# Patient Record
Sex: Female | Born: 1965 | Race: White | Hispanic: No | Marital: Married | Smoking: Never smoker
Health system: Southern US, Community
[De-identification: ages and names within clinical notes are randomized; demographics above are authoritative.]

## PROBLEM LIST (undated history)

## (undated) DIAGNOSIS — G43909 Migraine, unspecified, not intractable, without status migrainosus: Secondary | ICD-10-CM

## (undated) HISTORY — DX: Migraine, unspecified, not intractable, without status migrainosus: G43.909

## (undated) HISTORY — PX: BACK SURGERY: SHX140

---

## 1999-11-03 ENCOUNTER — Other Ambulatory Visit: Admission: RE | Admit: 1999-11-03 | Discharge: 1999-11-03 | Payer: Self-pay | Admitting: Obstetrics and Gynecology

## 2000-05-20 ENCOUNTER — Inpatient Hospital Stay (HOSPITAL_COMMUNITY): Admission: AD | Admit: 2000-05-20 | Discharge: 2000-05-23 | Payer: Self-pay | Admitting: Obstetrics and Gynecology

## 2000-06-22 ENCOUNTER — Other Ambulatory Visit: Admission: RE | Admit: 2000-06-22 | Discharge: 2000-06-22 | Payer: Self-pay | Admitting: Obstetrics and Gynecology

## 2002-07-12 ENCOUNTER — Other Ambulatory Visit: Admission: RE | Admit: 2002-07-12 | Discharge: 2002-07-12 | Payer: Self-pay | Admitting: Obstetrics and Gynecology

## 2003-11-08 ENCOUNTER — Other Ambulatory Visit: Admission: RE | Admit: 2003-11-08 | Discharge: 2003-11-08 | Payer: Self-pay | Admitting: Obstetrics and Gynecology

## 2005-05-20 ENCOUNTER — Encounter: Admission: RE | Admit: 2005-05-20 | Discharge: 2005-05-20 | Payer: Self-pay | Admitting: Orthopedic Surgery

## 2005-06-07 ENCOUNTER — Encounter: Admission: RE | Admit: 2005-06-07 | Discharge: 2005-06-07 | Payer: Self-pay | Admitting: Orthopedic Surgery

## 2005-09-03 ENCOUNTER — Encounter: Admission: RE | Admit: 2005-09-03 | Discharge: 2005-09-03 | Payer: Self-pay | Admitting: Orthopedic Surgery

## 2005-09-28 ENCOUNTER — Emergency Department (HOSPITAL_COMMUNITY): Admission: EM | Admit: 2005-09-28 | Discharge: 2005-09-28 | Payer: Self-pay | Admitting: Emergency Medicine

## 2006-03-21 ENCOUNTER — Encounter: Admission: RE | Admit: 2006-03-21 | Discharge: 2006-03-21 | Payer: Self-pay | Admitting: Orthopedic Surgery

## 2006-04-07 ENCOUNTER — Encounter: Admission: RE | Admit: 2006-04-07 | Discharge: 2006-04-07 | Payer: Self-pay | Admitting: *Deleted

## 2006-05-09 ENCOUNTER — Encounter: Admission: RE | Admit: 2006-05-09 | Discharge: 2006-05-09 | Payer: Self-pay | Admitting: *Deleted

## 2006-05-23 ENCOUNTER — Encounter: Admission: RE | Admit: 2006-05-23 | Discharge: 2006-05-23 | Payer: Self-pay | Admitting: *Deleted

## 2006-07-26 ENCOUNTER — Emergency Department (HOSPITAL_COMMUNITY): Admission: EM | Admit: 2006-07-26 | Discharge: 2006-07-26 | Payer: Self-pay | Admitting: Emergency Medicine

## 2006-07-29 ENCOUNTER — Encounter: Admission: RE | Admit: 2006-07-29 | Discharge: 2006-07-29 | Payer: Self-pay | Admitting: Orthopedic Surgery

## 2008-12-26 ENCOUNTER — Encounter: Admission: RE | Admit: 2008-12-26 | Discharge: 2008-12-26 | Payer: Self-pay | Admitting: Internal Medicine

## 2009-02-06 ENCOUNTER — Encounter: Admission: RE | Admit: 2009-02-06 | Discharge: 2009-02-06 | Payer: Self-pay | Admitting: Gastroenterology

## 2010-11-22 ENCOUNTER — Encounter: Payer: Self-pay | Admitting: *Deleted

## 2010-11-22 ENCOUNTER — Encounter: Payer: Self-pay | Admitting: Orthopedic Surgery

## 2011-03-19 NOTE — Op Note (Signed)
[facility]  Patient:    Tracy, Holder                        MRN: [id number] Proc. Date: 05/20/00 Adm. Date:  [id number] Disc. Date: [id number] Attending:  Lenoard Aden CC:         Lenoard Aden, M.D.                           Operative Report  PREOPERATIVE DIAGNOSIS:  A 39-week intrauterine pregnancy, previous cesarean section with desire for elective repeat.  POSTOPERATIVE DIAGNOSIS:  A 39-week intrauterine pregnancy, previous cesarean section with desire for elective repeat.  OPERATION:  Repeat low segment transverse cesarean section.  SURGEON:  Lenoard Aden, M.D.  Threasa HeadsCyndia Bent.  ESTIMATED BLOOD LOSS:  1000 cc.  ANESTHESIA:  Spinal, Raul Del, M.D.  COMPLICATIONS:  None.  DRAINS:  Foley catheter.  COUNTS:  Correct.  Patient to recovery in good condition.  FINDINGS:  Full term living female, Apgars 9 and 9, delivery via vacuum assistance x 1 pull.  Placenta noted anterior, three-vessel cord noted.  DESCRIPTION OF PROCEDURE:  After being aprised of the risks of anesthesia, infection, bleeding or injury to abdominal organs, need for repair.  The patient was brought to the operating room where she was administered a spinal anesthetic without complications, prepped and draped in the usual sterile fashion.  Foley catheter placed.  Pfannenstiel skin incision made with a scalpel and the area of the old incision carried down to fascia which was nicked in the midline ____________ using Mayo scissors.  The rectus muscle was dissected sharply in the midline.  The peritoneum entered sharply. Bladder blade placed.  The visceral peritoneum scored in smiling fashion. Uterus scored in smiling fashion through a very thin lower uterine segment. Delivery of full term living female using vacuum assistance due to floating presentation, was handed to the pediatricians in attendance after atraumatic delivery.  Apgars 8 and 9.   Placenta delivered manually intact.  Three vessel cord noted.  Uterus exteriorized.  Normal tubes and ovaries noted.  Uterus curetted using dry lap pack and cervix dilated using sponge forceps.  Uterine incision closed using 0 Monocryl in a continuous running fashion.  Good hemostasis achieved.  A small incisional hematoma at the right right margin was sutured using a delayed interrupted suture and a small hematoma along the left aspect of the incision was sutured.  Good hemostasis was then achieved. The paracolic gutters were irrigated.  All blood clots were subsequently removed.  Bladder flap was inspected and found to be hemostatic.  A small bleeder on the left rectus muscle fascia was idenficied and cauterized.  The fascia then closed using 0 Vicryl in continuous running fashion.  Dilute Marcaine solution placed in the skin.  Incision was then revised. The old incision was removed and undermined using  cautery and a scalpel.  At tims time incisions closed using staples.  The patient tolerated the procedure well and is to recovery in good condition. DD:  05/20/00 TD:  05/24/00 Job: [id number]

## 2011-03-19 NOTE — Discharge Summary (Signed)
[facility]  Patient:    Tracy Holder, Tracy Holder                        MRN: [id number] Adm. Date:  [id number] Disc. Date: [id number] Attending:  Lenoard Aden                           Discharge Summary  HOSPITAL COURSE:              The patient underwent uncomplicated repeat C section on May 20, 2000.  Postoperative hemoglobin 10, hematocrit 27.9.  She tolerated a regular diet well.  Discharged to home on day #3.  DISCHARGE MEDICATIONS:        Prenatal vitamins, iron, Motrin, Tylox given.  FOLLOWUP:                     In the office in four to six weeks.  Discharge teaching done. DD:  06/17/00 TD:  06/20/00 Job: [id number]

## 2011-03-19 NOTE — H&P (Signed)
[facility]  Patient:    Tracy Holder, Tracy Holder                          MRN: [id number] Adm. Date:  05/20/00 Attending:  Lenoard Aden, M.D. CC:         [facility]                         History and Physical  CHIEF COMPLAINT:                  Repeat cesarean section.  HISTORY OF PRESENT ILLNESS:       A 45 year old white female, G3, P1-0-1-1, EDD of May 27, 2000, at 39+ weeks, who presents for elective repeat C section.  PAST MEDICAL HISTORY:             Remarkable for C section in 1998 for nonreassuring fetal heart rate tracing and spontaneous miscarriage in 1997. Otherwise medical history remarkable for scoliosis with Harrington rod placement in 1986.  ALLERGIES:                        No known drug allergies.  MEDICATIONS:                      Prenatal vitamins and iron.  PREVIOUS PREGNANCY:               Child with cleft palate status post surgery.  PRENATAL LABORATORY DATA:         Blood type A positive, Rh antibody negative, rubella immune, hepatitis B surface antigen negative, HIV nonreactive, GBS negative.  PHYSICAL EXAMINATION:  GENERAL:                          She is a well-developed, well-nourished white female in no apparent distress.  HEENT:                            Normal.  LUNGS:                            Clear.  HEART:                            Regular rate and rhythm.  ABDOMEN:                          Soft, gravid, nontender.  Estimated fetal weight 8 pounds.  PELVIC:                           Cervix is closed, long, presenting part is vertex, -3.  NEUROLOGIC:                       Nonfocal.  EXTREMITIES:                      No cords.  IMPRESSION:                      A 39-week intrauterine pregnancy for elective repeat cesarean section.  PLAN:  Proceed with elective repeat C section. Risks of anesthesia, infection, bleeding, injury to abdominal organs, need for repair  discussed.  The patient acknowledges and desires to proceed.DD: 05/20/00 TD:  05/20/00 Job: [id number]

## 2012-03-29 ENCOUNTER — Other Ambulatory Visit: Payer: Self-pay | Admitting: Family Medicine

## 2012-03-29 ENCOUNTER — Other Ambulatory Visit (HOSPITAL_COMMUNITY)
Admission: RE | Admit: 2012-03-29 | Discharge: 2012-03-29 | Disposition: A | Discharge status: Home / Self Care | Payer: BC Managed Care – PPO | Source: Ambulatory Visit | Attending: Family Medicine | Admitting: Family Medicine

## 2012-03-29 DIAGNOSIS — Z1159 Encounter for screening for other viral diseases: Secondary | ICD-10-CM | POA: Insufficient documentation

## 2012-03-29 DIAGNOSIS — Z124 Encounter for screening for malignant neoplasm of cervix: Secondary | ICD-10-CM | POA: Insufficient documentation

## 2014-07-02 ENCOUNTER — Ambulatory Visit (INDEPENDENT_AMBULATORY_CARE_PROVIDER_SITE_OTHER): Payer: BC Managed Care – PPO | Admitting: General Surgery

## 2014-07-04 ENCOUNTER — Other Ambulatory Visit: Payer: Self-pay | Admitting: Otolaryngology

## 2014-07-04 DIAGNOSIS — G43909 Migraine, unspecified, not intractable, without status migrainosus: Secondary | ICD-10-CM

## 2014-07-09 ENCOUNTER — Ambulatory Visit (INDEPENDENT_AMBULATORY_CARE_PROVIDER_SITE_OTHER): Payer: BC Managed Care – PPO | Admitting: General Surgery

## 2014-07-09 ENCOUNTER — Other Ambulatory Visit (INDEPENDENT_AMBULATORY_CARE_PROVIDER_SITE_OTHER): Payer: Self-pay

## 2014-07-09 DIAGNOSIS — K429 Umbilical hernia without obstruction or gangrene: Secondary | ICD-10-CM

## 2014-07-10 ENCOUNTER — Ambulatory Visit
Admission: RE | Admit: 2014-07-10 | Discharge: 2014-07-10 | Disposition: A | Discharge status: Home / Self Care | Payer: BC Managed Care – PPO | Source: Ambulatory Visit | Attending: Otolaryngology | Admitting: Otolaryngology

## 2014-07-10 DIAGNOSIS — G43909 Migraine, unspecified, not intractable, without status migrainosus: Secondary | ICD-10-CM

## 2014-07-10 MED ORDER — GADOBENATE DIMEGLUMINE 529 MG/ML IV SOLN
10.0000 mL | Freq: Once | INTRAVENOUS | Status: AC | PRN
  Administered 2014-07-10: 10 mL via INTRAVENOUS

## 2014-07-12 ENCOUNTER — Encounter (INDEPENDENT_AMBULATORY_CARE_PROVIDER_SITE_OTHER): Payer: Self-pay

## 2014-07-12 ENCOUNTER — Ambulatory Visit
Admission: RE | Admit: 2014-07-12 | Discharge: 2014-07-12 | Disposition: A | Discharge status: Home / Self Care | Payer: BC Managed Care – PPO | Source: Ambulatory Visit | Attending: General Surgery | Admitting: General Surgery

## 2014-07-12 DIAGNOSIS — K429 Umbilical hernia without obstruction or gangrene: Secondary | ICD-10-CM

## 2014-08-20 ENCOUNTER — Encounter (INDEPENDENT_AMBULATORY_CARE_PROVIDER_SITE_OTHER): Payer: Self-pay

## 2014-08-20 ENCOUNTER — Ambulatory Visit (INDEPENDENT_AMBULATORY_CARE_PROVIDER_SITE_OTHER): Payer: BC Managed Care – PPO | Admitting: Neurology

## 2014-08-20 ENCOUNTER — Encounter: Payer: Self-pay | Admitting: Neurology

## 2014-08-20 VITALS — BP 118/80 | HR 64 | Ht 62.0 in | Wt 125.4 lb

## 2014-08-20 DIAGNOSIS — G500 Trigeminal neuralgia: Secondary | ICD-10-CM

## 2014-08-20 DIAGNOSIS — G43709 Chronic migraine without aura, not intractable, without status migrainosus: Secondary | ICD-10-CM

## 2014-08-20 MED ORDER — METHYLPREDNISOLONE 4 MG PO KIT: PACK | ORAL | Status: DC

## 2014-08-20 NOTE — Progress Notes (Addendum)
[facility]    Provider:  Dr Lucia GaskinsAhern Referring Provider: Suzanna ObeyByers, John, MD Primary Care Physician:  No primary provider on file.  CC:  Trigeminal neuralgia  HPI:  Tracy Holder is a 48 y.o. female here as a referral from Dr. Jearld FentonByers for Trigeminal Neuralgia  Left facial numbness and tingling been going on for 6 months. Just started one day, no inciting factors. Went to the ENT because she thought it was sinus related. ENT ruled out any sinus infections. She has numbness on the left side of the face around the eye and cheek to the left ear, sometimes tingly. Continuous, waxes and wanes as far as the severity. Unknown triggers or what makes it better or worse. Her left eye really bothers her like pressure. No shooting pains, no electric, shock-like or stabbing pains. She was scoped by ENT and she was given an antibiotic which significantly helped but 2 weeks afterwards the symptoms came back. The symptoms can become 8-9/10 severe pain. She gets a heaviness in left face, puffiness. She has some neck pain. No other focal or otherwise neurologic symptoms. Allergy meds didn't help.   She has a history of migraines. They have been going on for years. The past year they are worse. At least 5 days a week for the last year, they last 6-12 hours a day sometimes longer. They are debilitating 10/10 pain. Imitrex helps but doesn't totally take away the pain. Getting worse. Left side, throbbing, whole head with pressure, +light sensitivity, +sound sensitivity. Has to go into a dark room. +nausea. She does not take OTC medications more than 2 days per week or 2x a day. She is on Lexapro.  She has tried and failed Topamax, cyclobenzaprine, fluoxetine.   Shingles 10 years ago in the same left face area. But these symptoms didn't start until 6 months ago.  Reviewed notes, labs and imaging from outside physicians, which showed: Personally reviewed MRi of the brain, no large ischemic strokes, no  tumors or masses, normal architecture, nothing in the pons area or cp angle compressing CN5. ENT notes stae that endoscopy did not show etiology for symptoms. Notes state symptoms for 1.5 years.   Review of Systems: Patient complains of symptoms per HPI as well as the following symptoms: eye pain, ringing in ears, numbness. Pertinent negatives per HPI. All others negative.   History   Social History  . Marital Status: Married    Spouse Name: N/A    Number of Children: 2  . Years of Education: MASTERS   Occupational History  . Not on file.   Social History Main Topics  . Smoking status: Never Smoker   . Smokeless tobacco: Never Used  . Alcohol Use: Yes     Comment: 1-2 WEEK  . Drug Use: No  . Sexual Activity: Not on file   Other Topics Concern  . Not on file   Social History Narrative  . No narrative on file    Family History  Problem Relation Age of Onset  . Migraines Neg Hx     Past Medical History  Diagnosis Date  . Migraine     Past Surgical History  Procedure Laterality Date  . Back surgery      HARRINGTON ROD    Current Outpatient Prescriptions  Medication Sig Dispense Refill  . Escitalopram Oxalate (LEXAPRO PO) Take by mouth as needed.      . SUMAtriptan Succinate (IMITREX PO) Take by mouth as needed.      .Marland Kitchen  methylPREDNISolone (MEDROL DOSEPAK) 4 MG tablet follow package directions  21 tablet  0   No current facility-administered medications for this visit.    Allergies as of 08/20/2014  . (No Known Allergies)    Vitals: BP 118/80  Pulse 64  Ht 5\' 2"  (1.575 m)  Wt 125 lb 6.4 oz (56.881 kg)  BMI 22.93 kg/m2 Last Weight:  Wt Readings from Last 1 Encounters:  08/20/14 125 lb 6.4 oz (56.881 kg)   Last Height:   Ht Readings from Last 1 Encounters:  08/20/14 5\' 2"  (1.575 m)    Physical exam: Exam: Gen: NAD, conversant, well nourised, well groomed                     CV: RRR, no MRG. No Carotid Bruits. No peripheral edema, warm,  nontender Eyes: Conjunctivae clear without exudates or hemorrhage  Neuro: Detailed Neurologic Exam  Speech:    Speech is normal; fluent and spontaneous with normal comprehension.  Cognition:    The patient is oriented to person, place, and time;     recent and remote memory intact;     language fluent;     normal attention, concentration,     fund of knowledge Cranial Nerves:    The pupils are equal, round, and reactive to light. The fundi are normal and spontaneous venous pulsations are present. Visual fields are full to finger confrontation. Extraocular movements are intact. Trigeminal sensation is intact and the muscles of mastication are normal. The face is symmetric. The palate elevates in the midline. Voice is normal. Shoulder shrug is normal. The tongue has normal motion without fasciculations.   Coordination:    Normal finger to nose and heel to shin. Normal rapid alternating movements.   Gait:    Heel-toe and tandem gait are normal.   Motor Observation:    No asymmetry, no atrophy, and no involuntary movements noted. Tone:    Normal muscle tone.    Posture:    Posture is normal. normal erect    Strength:    Strength is V/V in the upper and lower limbs.      Sensation: intact     Reflex Exam:  DTR's:    Deep tendon reflexes in the upper and lower extremities are normal bilaterally.   Toes:    The toes are downgoing bilaterally.   Clonus:    Clonus is absent.      Assessment/Plan:  48 year old female with PMHx chronic migraines and new-onset numbness and paresthesias in the left CNV2 area. MRI of the brain was unremarkable.  Will try a medrol dose pack. Will request botox for chronic migraines. Will call [facility] to see if there is any soft tissue imaging to look for compression of the trigeminal nerve in the face - spoke with Dr. Karin GoldenShogry who reviewed images specifically the skull base and v2 exit and no pathjology there to account for trigeminal  neuralgia so additional imaging not indicated.   Tracy DeanAntonia Lundyn Coste, MD  [facility] [address] [place], [place] [id number]  Phone [phone] Fax [phone]

## 2014-08-20 NOTE — Patient Instructions (Signed)
Overall you are doing fairly well but I do want to suggest a few things today:   Remember to drink plenty of fluid, eat healthy meals and do not skip any meals. Try to eat protein with a every meal and eat a healthy snack such as fruit or nuts in between meals. Try to keep a regular sleep-wake schedule and try to exercise daily, particularly in the form of walking, 20-30 minutes a day, if you can.   As far as your medications are concerned, I would like to suggest: Medrol dose pack. May also consider Trileptal or Tegretol in the future.   As far as diagnostic testing: will discuss further imaging with neuroradiology  I would like to see you back after workup complete, sooner if we need to. Please call us with any interim questions, concerns, problems, updates or refill requests.   Please also call us for any test results so we can go over those with you on the phone.  My clinical assistant and will answer any of your questions and relay your messages to me and also relay most of my messages to you.   Our phone number is [phone]. We also have an after hours call service for urgent matters and there is a physician on-call for urgent questions. For any emergencies you know to call 911 or go to the nearest emergency room

## 2014-08-21 ENCOUNTER — Telehealth: Payer: Self-pay | Admitting: Neurology

## 2014-08-21 ENCOUNTER — Encounter: Payer: Self-pay | Admitting: Neurology

## 2014-08-21 DIAGNOSIS — G43909 Migraine, unspecified, not intractable, without status migrainosus: Secondary | ICD-10-CM | POA: Insufficient documentation

## 2014-08-21 DIAGNOSIS — G500 Trigeminal neuralgia: Secondary | ICD-10-CM | POA: Insufficient documentation

## 2014-08-21 NOTE — Telephone Encounter (Signed)
Called and left a detailed message. Reviewed her MRi with neuroradiologist, no compression of 5th nerve at the cp angle or pons and no compression at the skull outlet or in the soft tissues of the face. No indication for further imaging. Also let he rknow that BC/BS needs documented failure of 2 classes of migraine ppx for botox approval and read the list, asked her to call back to discuss if she feels she meet this criteria.

## 2014-08-21 NOTE — Telephone Encounter (Signed)
Patient returning call to Dr. Lucia GaskinsAhern, please call back at [phone].

## 2014-08-26 ENCOUNTER — Telehealth: Payer: Self-pay | Admitting: Neurology

## 2014-08-26 NOTE — Telephone Encounter (Signed)
Patient stated she has completed methylPREDNISolone (MEDROL DOSEPAK) 4 MG tablet and was beneficial.  Please call and advise.

## 2014-08-27 NOTE — Telephone Encounter (Signed)
Called. No answer. Will try later.  

## 2014-08-28 ENCOUNTER — Other Ambulatory Visit: Payer: Self-pay | Admitting: Neurology

## 2014-08-28 DIAGNOSIS — G43709 Chronic migraine without aura, not intractable, without status migrainosus: Secondary | ICD-10-CM

## 2014-08-28 MED ORDER — METHYLPREDNISOLONE 4 MG PO KIT: PACK | ORAL | Status: DC

## 2014-08-28 NOTE — Telephone Encounter (Signed)
Patient states she was told to call Dr. Lucia GaskinsAhern on 08/26/14 and let her know if the methylPREDNISolone (MEDROL DOSEPAK) 4 MG tablet has worked. Patient states it did help a little. She is requesting to know if she will do another round as discussed. Advised would fwd to Dr. Lucia GaskinsAhern for advice. Patient agreed.

## 2014-08-28 NOTE — Telephone Encounter (Signed)
Patient returning call, please call back @ [phone].

## 2014-08-28 NOTE — Telephone Encounter (Signed)
Would you call her back and let her know I did refill. Thank you

## 2014-08-29 ENCOUNTER — Other Ambulatory Visit: Payer: Self-pay | Admitting: Neurology

## 2014-08-29 MED ORDER — VALACYCLOVIR HCL 1 G PO TABS: 1000.0000 mg | ORAL_TABLET | Freq: Three times a day (TID) | ORAL | Status: DC

## 2014-08-29 NOTE — Telephone Encounter (Signed)
I called in both the steroid and antiviral. Please let he rknow thanks

## 2014-08-29 NOTE — Telephone Encounter (Signed)
Patient calling back to state that she wants to try the anti-viral that was discussed, please return call and advise.

## 2014-08-29 NOTE — Telephone Encounter (Signed)
Spoke to patient. Gave previous instructions per Dr. Lucia GaskinsAhern. She would like to go fwd with the anit-viral med discussed also.

## 2014-08-30 NOTE — Telephone Encounter (Signed)
Called patient and left a message that her meds were called in to her pharmacy.

## 2014-09-12 ENCOUNTER — Ambulatory Visit (INDEPENDENT_AMBULATORY_CARE_PROVIDER_SITE_OTHER): Payer: BC Managed Care – PPO | Admitting: Neurology

## 2014-09-12 ENCOUNTER — Encounter: Payer: Self-pay | Admitting: Neurology

## 2014-09-12 VITALS — BP 106/73 | HR 80 | Ht 63.0 in | Wt 126.8 lb

## 2014-09-12 DIAGNOSIS — G43709 Chronic migraine without aura, not intractable, without status migrainosus: Secondary | ICD-10-CM

## 2014-09-12 DIAGNOSIS — G43719 Chronic migraine without aura, intractable, without status migrainosus: Secondary | ICD-10-CM

## 2014-09-12 MED ORDER — CARBAMAZEPINE ER 200 MG PO TB12: 200.0000 mg | ORAL_TABLET | Freq: Two times a day (BID) | ORAL | Status: DC

## 2014-09-12 MED ORDER — SUMATRIPTAN SUCCINATE 100 MG PO TABS: ORAL_TABLET | ORAL | Status: DC

## 2014-09-12 NOTE — Progress Notes (Signed)
     BOTOX PROCEDURE NOTE FOR MIGRAINE HEADACHE   HISTORY: She has a history of migraines. They have been going on for years. The past year they are worse. At least 5 days a week for the last year, they last 6-12 hours a day sometimes longer. They are debilitating 10/10 pain. Imitrex helps but doesn't totally take away the pain. Getting worse. Left side, throbbing, whole head with pressure, +light sensitivity, +sound sensitivity. Has to go into a dark room. +nausea. She does not take OTC medications more than 2 days per week or 2x a day. She is on Lexapro. She has tried and failed Topamax, cyclobenzaprine, fluoxetine.    Description of procedure:  The patient was placed in a sitting position. The standard protocol was used for Botox as follows, with 5 units of Botox injected at each site:   -Procerus muscle, midline injection  -Corrugator muscle, bilateral injection  -Frontalis muscle, bilateral injection, with 2 sites each side, medial injection was performed in the upper one third of the frontalis muscle, in the region vertical from the medial inferior edge of the superior orbital rim. The lateral injection was again in the upper one third of the forehead vertically above the lateral limbus of the cornea, 1.5 cm lateral to the medial injection site.  -Temporalis muscle injection, 4 sites, bilaterally. The first injection was 3 cm above the tragus of the ear, second injection site was 1.5 cm to 3 cm up from the first injection site in line with the tragus of the ear. The third injection site was 1.5-3 cm forward between the first 2 injection sites. The fourth injection site was 1.5 cm posterior to the second injection site.  -Occipitalis muscle injection, 3 sites, bilaterally. The first injection was done one half way between the occipital protuberance and the tip of the mastoid process behind the ear. The second injection site was done lateral and superior to the first, 1 fingerbreadth from  the first injection. The third injection site was 1 fingerbreadth superiorly and medially from the first injection site.  -Cervical paraspinal muscle injection, 2 sites, bilateral knee first injection site was 1 cm from the midline of the cervical spine, 3 cm inferior to the lower border of the occipital protuberance. The second injection site was 1.5 cm superiorly and laterally to the first injection site.  -Trapezius muscle injection was performed at 3 sites, bilaterally. The first injection site was in the upper trapezius muscle halfway between the inflection point of the neck, and the acromion. The second injection site was one half way between the acromion and the first injection site. The third injection was done between the first injection site and the inflection point of the neck.   A 200 unit bottle of Botox was used, 155 units were injected, the rest of the Botox was wasted. Lot [id number] exp 12/2016 100 units x 2. The patient tolerated the procedure well, there were no complications of the above procedure.

## 2014-09-23 ENCOUNTER — Telehealth: Payer: Self-pay | Admitting: Neurology

## 2014-09-23 NOTE — Telephone Encounter (Signed)
Patient is calling to ask if Dellie Burnsegratol can cause headaches because patient has more headaches since taking this medication. The left side of patient's face is feeling worse and patient statesTegratol is not helping. Please call.

## 2014-09-24 ENCOUNTER — Other Ambulatory Visit: Payer: Self-pay | Admitting: Neurology

## 2014-09-24 MED ORDER — GABAPENTIN 300 MG PO CAPS: 300.0000 mg | ORAL_CAPSULE | Freq: Three times a day (TID) | ORAL | Status: DC

## 2014-09-28 ENCOUNTER — Other Ambulatory Visit: Payer: Self-pay | Admitting: Neurology

## 2014-09-28 DIAGNOSIS — G509 Disorder of trigeminal nerve, unspecified: Secondary | ICD-10-CM

## 2014-09-28 DIAGNOSIS — R202 Paresthesia of skin: Secondary | ICD-10-CM

## 2014-09-28 DIAGNOSIS — M359 Systemic involvement of connective tissue, unspecified: Secondary | ICD-10-CM

## 2014-09-28 NOTE — Progress Notes (Signed)
48 year old female with no significant PMHx with numbness of the trigeminal nerve in the left V3 distribution for 6 months. No pain, no lesions. Will order an MRI of the brain w/wo contrast to evaluate for possible ischaemic or haemorrhagic lesions of the brainstem that can cause isolated cranial nerve palsies; small lesions of the brainstem can affect the trigeminal nerve only. Other causes include including traumatic, vascular,inflammatory, demyelinating, infectious, and neoplastic. Will check renal function for the contrast. Will start a medrol dosepak. Has associated hearing loss and left ear pain, will refer to ENT for evaluation.   48 year old female with a PMHx of HIV, HTN, Diabetes presenting with vision changes, right face pain and paresthesias as well as pain in the occipital areas that is atypical for TN and/or occipital neuralgia, right hand paresthesias and weakness. Will order an MRI of the brain w/wo contrast to evaluate for possible ischemic or hemorrhagic lesions of the brainstem that can cause isolated cranial nerve palsies; small lesions of the brainstem can affect the trigeminal nerve only. Other causes include including traumatic, vascular,inflammatory, demyelinating, infectious, and neoplastic. Will check renal function for the contrast. Will start a Neurontin for pain. Has associated occipital pain and neck pain, both worse with cough so will need to also check MRI of the cervical spine to eval for compressive lesions. Given her HIV status will also look for infectious causes.  She also reports excessive fatigue, Snores, excessive daytime drowsiness, frequent awakenings, morbid obesity, chokes at night. She has vascular risk factors such as diabetes. OSA can increase risk of stroke, needs a sleep study.    Trauma:  Accidental, surgical, dental (especially at 3rd molar), chemical (glycerol rhizotomy), radiation Inflammatory/autoimmune:  Undifferentiated and mixed connective tissue  disease  Progressive systemic scleroderma, Sjo gren's syndrome, sarcoidosis, multiple sclerosis Vascular:  Pontomedullary ischemia or hemorrhage (CNS mimic)  Vascular malformation Neoplastic:  Intra- or extra-cranial compression (meningioma, trigeminal or vestibular schwannoma, nasopharyngeal carcinoma)  Perineural spread (adenoid cystic carcinoma, squamous cell carcinoma, lymphoma)  Metastasis (breast and lung carcinoma, melanoma)  Carcinomatous meningitis (breast and lung carcinoma, melanoma, lymphoma) Infectious:  Leprosy, viruses (varicella zoster virus, herpes simplex virus), Lyme disease, syphilis, fungi (actinomycosis) Degenerative:  Kennedy's disease Toxic-metabolic  Stilbamidine, trichloroethylene, oxaliplatin, diabetes mellitus Congenital:  Congenital trigeminal anesthesia with or without Goldenhar-Gorlin syndrome or Mo bius syndrome  Skull base anomalies Idiopathic trigeminal neuropathy Other:  Amyloidosis, pseudotumor cerebri

## 2014-09-29 ENCOUNTER — Other Ambulatory Visit: Payer: Self-pay | Admitting: Neurology

## 2014-09-29 DIAGNOSIS — G509 Disorder of trigeminal nerve, unspecified: Secondary | ICD-10-CM

## 2014-09-29 MED ORDER — PREDNISONE 5 MG PO TABS: 5.0000 mg | ORAL_TABLET | Freq: Every day | ORAL | Status: DC

## 2014-09-29 NOTE — Telephone Encounter (Signed)
Emailed with patient regarding her left-sided trigeminal neuropathy/neuralgia. She is still very concerned. Symptoms improved when on steroids and returned when she stopped. Discussed the following plan with patient:  - Since patient improved only while on the prednisone, I think we should go ahead and order labs for inflammatory,autoimmune and infectious disorders. Labs ordered. - Original MRI of the brain was not performed with the Trigeminal Neuropathy protocol. Discuss[facility]. Will repeat with this protocol. - Will also image the cervical spine to look for any lesions - after the labs are drawn, patient would like to take a longer course of steroids as a trial. Will order low-dose steroids daily for 2-3 weeks.

## 2014-10-16 ENCOUNTER — Other Ambulatory Visit: Payer: Self-pay | Admitting: Neurology

## 2014-10-16 ENCOUNTER — Other Ambulatory Visit (INDEPENDENT_AMBULATORY_CARE_PROVIDER_SITE_OTHER): Payer: Self-pay

## 2014-10-16 DIAGNOSIS — M359 Systemic involvement of connective tissue, unspecified: Secondary | ICD-10-CM

## 2014-10-16 DIAGNOSIS — R202 Paresthesia of skin: Secondary | ICD-10-CM

## 2014-10-16 DIAGNOSIS — G509 Disorder of trigeminal nerve, unspecified: Secondary | ICD-10-CM

## 2014-10-16 DIAGNOSIS — Z0289 Encounter for other administrative examinations: Secondary | ICD-10-CM

## 2014-10-17 ENCOUNTER — Telehealth: Payer: Self-pay | Admitting: Neurology

## 2014-10-17 LAB — HSV(HERPES SIMPLEX VRS) I + II AB-IGG
HAV 1 IGG,TYPE SPECIFIC AB: <0.91 index (ref 0.00–0.90)
HSV 2 IGG,TYPE SPECIFIC AB: <0.91 index (ref 0.00–0.90)

## 2014-10-17 LAB — ANTINEUTROPHIL CYTOPLASMIC AB

## 2014-10-17 LAB — CBC
HCT: 37.5 % (ref 34.0–46.6)
HEMOGLOBIN: 12.8 g/dL (ref 11.1–15.9)
MCH: 31.7 pg (ref 26.6–33.0)
MCHC: 34.1 g/dL (ref 31.5–35.7)
MCV: 93 fL (ref 79–97)
Platelets: 228 10*3/uL (ref 150–379)
RBC: 4.04 x10E6/uL (ref 3.77–5.28)
RDW: 14.2 % (ref 12.3–15.4)
WBC: 5.1 10*3/uL (ref 3.4–10.8)

## 2014-10-17 LAB — COMPREHENSIVE METABOLIC PANEL
ALT: 12 IU/L (ref 0–32)
AST: 22 IU/L (ref 0–40)
Albumin/Globulin Ratio: 1.9 (ref 1.1–2.5)
Albumin: 4.4 g/dL (ref 3.5–5.5)
Alkaline Phosphatase: 59 IU/L (ref 39–117)
BUN/Creatinine Ratio: 15 (ref 9–23)
BUN: 12 mg/dL (ref 6–24)
CALCIUM: 9.2 mg/dL (ref 8.7–10.2)
CHLORIDE: 102 mmol/L (ref 97–108)
CO2: 24 mmol/L (ref 18–29)
Creatinine, Ser: 0.78 mg/dL (ref 0.57–1.00)
GFR calc Af Amer: 105 mL/min/{1.73_m2} (ref >59)
GFR calc non Af Amer: 91 mL/min/{1.73_m2} (ref >59)
Globulin, Total: 2.3 g/dL (ref 1.5–4.5)
Glucose: 60 mg/dL — ABNORMAL LOW (ref 65–99)
POTASSIUM: 3.8 mmol/L (ref 3.5–5.2)
SODIUM: 139 mmol/L (ref 134–144)
Total Bilirubin: 0.3 mg/dL (ref 0.0–1.2)
Total Protein: 6.7 g/dL (ref 6.0–8.5)

## 2014-10-17 LAB — HIV ANTIBODY (ROUTINE TESTING W REFLEX): HIV-1/HIV-2 Ab: NONREACTIVE

## 2014-10-17 LAB — PAN-ANCA
ANCA Proteinase 3: <3.5 U/mL (ref 0.0–3.5)
Atypical pANCA: <1:20 {titer}
C-ANCA: <1:20 {titer}
Myeloperoxidase Ab: <9 U/mL (ref 0.0–9.0)
P-ANCA: <1:20 {titer}

## 2014-10-17 LAB — EXTRACTABLE NUCLEAR ANTIGEN ANTIBODY: ENA RNP Ab: <0.2 AI (ref 0.0–0.9)

## 2014-10-17 LAB — RPR: SYPHILIS RPR SCR: NONREACTIVE

## 2014-10-17 LAB — LYME, TOTAL AB TEST/REFLEX

## 2014-10-17 LAB — SEDIMENTATION RATE: SED RATE: 3 mm/h (ref 0–32)

## 2014-10-17 LAB — C-REACTIVE PROTEIN: CRP: 1.4 mg/L (ref 0.0–4.9)

## 2014-10-17 LAB — SPECIMEN STATUS REPORT

## 2014-10-17 LAB — ANGIOTENSIN CONVERTING ENZYME: ANGIO CONVERT ENZYME: 44 U/L (ref 14–82)

## 2014-10-17 LAB — RHEUMATOID FACTOR: Rhuematoid fact SerPl-aCnc: 10.3 IU/mL (ref 0.0–13.9)

## 2014-10-17 LAB — ANTI-SCLERODERMA ANTIBODY: Scleroderma SCL-70: <0.2 AI (ref 0.0–0.9)

## 2014-10-17 LAB — ANA W/REFLEX: Anti Nuclear Antibody(ANA): NEGATIVE

## 2014-10-17 LAB — ANTI-DNA ANTIBODY, DOUBLE-STRANDED: DSDNA AB: 2 [IU]/mL (ref 0–9)

## 2014-10-17 NOTE — Telephone Encounter (Signed)
Called and left a message. Would you call in the morning please Judeth CornfieldStephanie and see what clarification he needed? Thank you.

## 2014-10-17 NOTE — Telephone Encounter (Signed)
Josh with [employer] @ [phone], requesting clarification for blood work orders received.  Please call and advise.

## 2014-10-17 NOTE — Telephone Encounter (Signed)
Please advise 

## 2014-10-18 LAB — SMITH ANTIBODIES: ENA SM Ab Ser-aCnc: <0.2 AI (ref 0.0–0.9)

## 2014-10-18 LAB — SPECIMEN STATUS REPORT

## 2014-10-19 LAB — CYCLIC CITRUL PEPTIDE ANTIBODY, IGG/IGA: CYCLIC CITRULLIN PEPTIDE AB: 2 U (ref 0–19)

## 2014-10-19 LAB — SPECIMEN STATUS REPORT

## 2014-10-23 ENCOUNTER — Telehealth: Payer: Self-pay | Admitting: *Deleted

## 2014-10-23 NOTE — Telephone Encounter (Signed)
Tried calling patient with normal lab results but unable to contact. Patient mailbox is full not able to leave a message.

## 2014-10-23 NOTE — Telephone Encounter (Signed)
-----   Message from Anson FretAntonia B Ahern, MD sent at 10/21/2014  6:24 PM EST ----- Please let patient know all her labs were normal. Thank you.

## 2014-11-01 LAB — SPECIMEN STATUS REPORT

## 2014-11-05 NOTE — Telephone Encounter (Signed)
Patient returned call and I relayed lab results has stated in telephone note.  Patient verbalized understanding.

## 2014-11-07 ENCOUNTER — Telehealth: Payer: Self-pay | Admitting: *Deleted

## 2014-11-07 ENCOUNTER — Telehealth: Payer: Self-pay | Admitting: Neurology

## 2014-11-07 NOTE — Telephone Encounter (Signed)
Called and left message for pt to call and schedule. This has been my third message to have patient call to set up next botox injection.

## 2014-11-07 NOTE — Telephone Encounter (Signed)
Called and spoke to patient about normal lab results. She is aware and verbalizes understanding.  Patient had a couple of questions that she wanted to ask before she forgets to and one of them is if she could be put back on Lexapro 10 mg or will that interfere with her migraine med's. Patient really thinks that it is time to go back on it. The other question was is she suppose to make an appt to come back to the office so she can be taught on how use the [employer]ec[employer] patch or is she just suppose to read the directions? Patient is aware that Dr. Lucia GaskinsAhern is out of the office and will get to messages when she returns.   [facility]I- [facility] her [facility]'s. Waiting on response.

## 2014-11-08 ENCOUNTER — Other Ambulatory Visit: Payer: Self-pay | Admitting: Neurology

## 2014-11-08 MED ORDER — ESCITALOPRAM OXALATE 10 MG PO TABS: 10.0000 mg | ORAL_TABLET | Freq: Every day | ORAL | Status: DC

## 2014-11-08 NOTE — Telephone Encounter (Signed)
I called [employer] ([email or web address]).  Spoke with Delice Bisonara.  She said they call the patient's to do a phone training on this drug.  Says they are a bit behind, so the patient can most certainly call them any time, 24 hours a day, and they will be happy to go over application and use with her.  I called the patient.  Got no answer.  Left message with this info.

## 2014-11-08 NOTE — Telephone Encounter (Signed)
I conversed with patient. Prescribed her Lexapro. [employer] should come with a video tape and customer service hotline. Tracy Holder would you touch base on the zecuity patch with Tracy Holder please? Thank you.

## 2014-11-14 LAB — CYCLIC CITRUL PEPTIDE ANTIBODY, IGG/IGA: Cyclic Citrullin Peptide Ab: 4 units (ref 0–19)

## 2014-11-14 LAB — SPECIMEN STATUS REPORT

## 2014-11-14 LAB — ANTI-CENTROMERE B ANTIBODIES: Centromere Ab Screen: <0.2 AI (ref 0.0–0.9)

## 2014-12-09 ENCOUNTER — Telehealth: Payer: Self-pay

## 2014-12-09 NOTE — Telephone Encounter (Signed)
Called patient in error. No answer. Please apologize if patient returns call. Thanks.

## 2014-12-16 ENCOUNTER — Ambulatory Visit: Payer: Self-pay | Admitting: Neurology

## 2014-12-20 ENCOUNTER — Telehealth: Payer: Self-pay

## 2014-12-20 NOTE — Telephone Encounter (Signed)
Called patient in reference to r/s appt cancld on 12/16/14 due to inclement weather. No answer. Left vmail.

## 2014-12-24 ENCOUNTER — Telehealth: Payer: Self-pay | Admitting: *Deleted

## 2014-12-24 ENCOUNTER — Encounter: Payer: Self-pay | Admitting: *Deleted

## 2014-12-24 NOTE — Telephone Encounter (Signed)
Pt returned your call to rescheduled Botox.  I rescheduled appointment for Wed. 12/25/14 @ 9:00.

## 2014-12-24 NOTE — Telephone Encounter (Signed)
Tried calling patient about r/s appointment from 12/16/14. Gave patient [facility] phone number to call and reschedule. I will also send a letter.

## 2014-12-25 ENCOUNTER — Ambulatory Visit (INDEPENDENT_AMBULATORY_CARE_PROVIDER_SITE_OTHER): Payer: BLUE CROSS/BLUE SHIELD | Admitting: Neurology

## 2014-12-25 VITALS — BP 105/78 | HR 78 | Ht 62.5 in | Wt 128.0 lb

## 2014-12-25 DIAGNOSIS — G43701 Chronic migraine without aura, not intractable, with status migrainosus: Secondary | ICD-10-CM | POA: Diagnosis not present

## 2014-12-25 DIAGNOSIS — G500 Trigeminal neuralgia: Secondary | ICD-10-CM | POA: Diagnosis not present

## 2014-12-25 MED ORDER — ESCITALOPRAM OXALATE 20 MG PO TABS: 20.0000 mg | ORAL_TABLET | Freq: Every day | ORAL | Status: DC

## 2014-12-25 NOTE — Progress Notes (Signed)
[facility]    Provider:  Dr Lucia Gaskins Referring Provider: No ref. provider found Primary Care Physician:  No primary care provider on file.  CC:  migraines  HPI:  Tracy Holder is a 49 y.o. female here for Migraines and sinus headaches. Today we will perform sphenocath. She has a history of migraines. They have been going on for years. The past year they are worse. At least 5 days a week for the last year, they last 6-12 hours a day sometimes longer. They are debilitating 10/10 pain. Imitrex helps but doesn't totally take away the pain.  Left side, throbbing, whole head with pressure, +light sensitivity, +sound sensitivity. Has to go into a dark room. +nausea. She does not take OTC medications more than 2 days per week or 2x a day. She is on Lexapro. She has tried and failed Topamax, cyclobenzaprine, fluoxetine. Botox has helped but she still continues to have migraines.  SEE PROCEDURE NOTE BELOW  History   Social History  . Marital Status: Married    Spouse Name: Augusto Gamble   . Number of Children: 2  . Years of Education: MA[employer]ERS   Occupational History  . Not on file.   Social History Main Topics  . Smoking status: Never Smoker   . Smokeless tobacco: Never Used  . Alcohol Use: Yes     Comment: 1-2 WEEK  . Drug Use: No  . Sexual Activity: Not on file   Other Topics Concern  . Not on file   Social History Narrative   Patient lives at home with husband   Patient is right handed.    Patient has 2 children     Family History  Problem Relation Age of Onset  . Migraines Neg Hx     Past Medical History  Diagnosis Date  . Migraine     Past Surgical History  Procedure Laterality Date  . Back surgery      [facility] ROD    Current Outpatient Prescriptions  Medication Sig Dispense Refill  . escitalopram (LEXAPRO) 10 MG tablet Take 1 tablet (10 mg total) by mouth daily. 30 tablet 11  . predniSONE (DELTASONE) 5 MG tablet Take 1 tablet (5 mg total) by mouth  daily with breakfast. 30 tablet 3  . SUMAtriptan (IMITREX) 100 MG tablet Take one at start of headache. May repeat in 2 hours. Do not take more than 2 in a day or 2 days a week 10 tablet 6  . gabapentin (NEURONTIN) 300 MG capsule Take 1 capsule (300 mg total) by mouth 3 (three) times daily. (Patient not taking: Reported on 12/25/2014) 90 capsule 11   No current facility-administered medications for this visit.    Allergies as of 12/25/2014  . (No Known Allergies)    Vitals: BP 105/78 mmHg  Pulse 78  Ht 5' 2.5" (1.588 m)  Wt 128 lb (58.06 kg)  BMI 23.02 kg/m2 Last Weight:  Wt Readings from Last 1 Encounters:  12/25/14 128 lb (58.06 kg)   Last Height:   Ht Readings from Last 1 Encounters:  12/25/14 5' 2.5" (1.588 m)    Physical exam: Exam: Gen: NAD, conversant, well nourised, well groomed                     CV: RRR, no MRG. No Carotid Bruits. No peripheral edema, warm, nontender Eyes: Conjunctivae clear without exudates or hemorrhage  Neuro: Detailed Neurologic Exam  Speech:    Speech is normal; fluent and spontaneous with normal  comprehension.  Cognition:    The patient is oriented to person, place, and time;     recent and remote memory intact;     language fluent;     normal attention, concentration,     fund of knowledge Cranial Nerves:    The pupils are equal, round, and reactive to light. The fundi are normal and spontaneous venous pulsations are present. Visual fields are full to finger confrontation. Extraocular movements are intact. Trigeminal sensation is intact and the muscles of mastication are normal. The face is symmetric. The palate elevates in the midline. Hearing intact. Voice is normal. Shoulder shrug is normal. The tongue has normal motion without fasciculations.   Coordination:    Normal finger to nose and heel to shin. Normal rapid alternating movements.   Gait:    Heel-toe and tandem gait are normal.   Motor Observation:    No asymmetry, no  atrophy, and no involuntary movements noted. Tone:    Normal muscle tone.    Posture:    Posture is normal. normal erect    Strength:    Strength is V/V in the upper and lower limbs.      Sensation: intact to LT     Reflex Exam:  DTR's:    Deep tendon reflexes in the upper and lower extremities are normal bilaterally.   Toes:    The toes are downgoing bilaterally.   Clonus:    Clonus is absent.     [facility]  Procedure: The patient was placed in the supine position. A temperature strip was added to the cheek area after the area was cleaned with alcohol. The Sphenocath was lubricated with gel, and placed in the right naris. The catheter was inserted above the middle turbinate to the posterior nasal cavity, and then withdrawn 1 cm. The catheter was deployed and rotated approximately 20 towards the nose. 2-1/2 mL of 2% lidocaine was deployed. The patient was asked to swallow during the injection. The patient demonstrated erythema of the sclera of the eye on this side, and an increase in the cheek temperature was noted from 92 F to 94 F.  This process was repeated on the left side, with similar results. The increase in cheek temperature was documented from 3792 F to *94 F.  The patient tolerated the procedure well. No complications of the procedure were noted. The patient was kept in the supine position for 8 minutes following the procedure. She was given small sips of water after sitting up following the procedure.  Lidocaine 2% NDC [id number]  Expiration date: 12/16 Lot number: [id number]  Elspeth ChoAhern, Antonia B   Antonia Ahern, MD  [facility] [address] [place], [place] [id number]  Phone [phone] Fax [phone]

## 2014-12-25 NOTE — Addendum Note (Signed)
Addended by: Naomie DeanAHERN, ANTONIA B on: 12/25/2014 09:17 PM   Modules accepted: Orders

## 2015-03-28 ENCOUNTER — Telehealth: Payer: Self-pay | Admitting: Neurology

## 2015-03-28 NOTE — Telephone Encounter (Signed)
[facility] and stated that they were unable to get a hold of the pt in order to authorize her injections. Reuel BoomDaniel stated that it is their protocol to place the medication on hold until they hear from the patient and he just wanted to inform our office. No need to return call.

## 2015-04-03 NOTE — Telephone Encounter (Signed)
Left detailed VM for pt to call us back regarding getting injections authorized. I told her [facility] has been trying to reach her regarding this issue. Gave [facility] phone number and office hours.

## 2015-04-03 NOTE — Telephone Encounter (Signed)
Janisha/casandra - can you help with this? I don't understand. Botox is a procedure code and not an office visit. I can't code for an office visit. Thanks.

## 2015-04-03 NOTE — Telephone Encounter (Signed)
Spoke with pt to let her know [facility] BoomDaniel [facility] has been trying to contact her about authorizing the injections. Pt is going to call Reuel BoomDaniel back at [phone] and see what information they give her. She stated the insurance will not cover injections due to a change in coding and needs to be coded as an office visit. She says she will have to pay 2000 dollars otherwise.  I told her I would speak with Dr. Lucia GaskinsAhern as well to see if she has any suggestions. Pt verbalized understanding.

## 2015-04-04 NOTE — Telephone Encounter (Signed)
Tad MooreCasandra - can you please call Ladoris GeneCarey Edmonds and try and figure this out about botox please? Can you take responsibility for it? Please let me know, thank you!

## 2015-04-04 NOTE — Telephone Encounter (Signed)
I'm not really sure as to what is going on I will call the pharmacy today but it seems that her insurance must have a different billing for Special[facility] pharmacy verses us buying and billing. So her out of pocket must be more billing it that way. Dr. Lucia GaskinsAhern you are correct in that botox is a procedure and has to be billed as such. I have had numerous conversations with Iona HansenCarey regarding having her injections done, And her contacting the pharmacy in order to give consent. They do not share with me the specifics of the patients co-pay or coverage details. she has not contacting me with any issues so I am not sure what is going on.

## 2015-04-04 NOTE — Telephone Encounter (Signed)
Called patient. No answer. Left vmail to return call so that we can get a better understanding of what is going on with Botox.

## 2015-04-10 NOTE — Telephone Encounter (Signed)
Thanks Casandra, that's all we can do.

## 2015-04-10 NOTE — Telephone Encounter (Signed)
Dr. Lucia Gaskins- I have been trying to contact [name] to find out if she is moving forward with Botox, but I am unable to reach her.  We are waiting on [name] to contact the speciality pharmacy to give  consent for med shipment to our office.

## 2015-04-16 NOTE — Telephone Encounter (Signed)
Patient returning Casandras call.  Please call her at [phone].

## 2015-04-17 NOTE — Telephone Encounter (Signed)
Tried to reach patient. No answer.  

## 2015-04-18 NOTE — Telephone Encounter (Signed)
Spoke to patient. Patient had confusion on Botox coverage concerning office change from buy and bill to speciality pharm. She says she was told by pharmacy she would have a higher out of pocket because we now use specialty pharmacy not buy and billl.  I spoke to the patient's specialty pharmacy was told Botox is covered through her pharmacy benefit which would be $50 copay covered @ 100%, speciality pharmacy will verify coverage through medical, will take 24-72hrs, will call back for status the early part of next week. Explained to patient. She was grateful. Will call patient once medical benefit verified by pharmacy.

## 2015-04-25 ENCOUNTER — Telehealth: Payer: Self-pay | Admitting: Neurology

## 2015-04-25 NOTE — Telephone Encounter (Signed)
Received call from patient advising that she has spoken with her [facility] and given consent to ship Botox to [facility] office.

## 2015-05-20 ENCOUNTER — Telehealth: Payer: Self-pay | Admitting: Neurology

## 2015-05-20 NOTE — Telephone Encounter (Signed)
Outreached to patient and left voicemail to call [facility] to schedule her next appointment for Botox. Botox delivered. Botox, 200 unitis, S/P, G43.719

## 2015-07-09 ENCOUNTER — Other Ambulatory Visit: Payer: Self-pay | Admitting: Neurology

## 2015-07-09 ENCOUNTER — Telehealth: Payer: Self-pay | Admitting: Neurology

## 2015-07-09 MED ORDER — SUMATRIPTAN SUCCINATE 100 MG PO TABS: ORAL_TABLET | ORAL | Status: DC

## 2015-07-09 MED ORDER — ESCITALOPRAM OXALATE 20 MG PO TABS: 20.0000 mg | ORAL_TABLET | Freq: Every day | ORAL | Status: DC

## 2015-07-09 MED ORDER — GABAPENTIN 300 MG PO CAPS: 300.0000 mg | ORAL_CAPSULE | Freq: Three times a day (TID) | ORAL | Status: DC

## 2015-07-09 NOTE — Telephone Encounter (Signed)
Please let her know I refilled all her meds, thanks!

## 2015-07-09 NOTE — Telephone Encounter (Signed)
Called patient to schedule botox apt. She requested to speak with the nurse regarding her medication Rx. SUMAtriptan (IMITREX) 100 MG tablet. She states that she is out of refills. Please call and advise.

## 2015-07-10 NOTE — Telephone Encounter (Signed)
Spoke w/ pt to let her know medication was refilled and ready to be picked up at her pharmacy. She verbalized understanding. She also verified she scheduled her next botox appt.

## 2015-07-23 ENCOUNTER — Ambulatory Visit (INDEPENDENT_AMBULATORY_CARE_PROVIDER_SITE_OTHER): Payer: BLUE CROSS/BLUE SHIELD | Admitting: Neurology

## 2015-07-23 VITALS — BP 118/80 | HR 77 | Wt 130.8 lb

## 2015-07-23 DIAGNOSIS — G43701 Chronic migraine without aura, not intractable, with status migrainosus: Secondary | ICD-10-CM

## 2015-07-23 DIAGNOSIS — G43719 Chronic migraine without aura, intractable, without status migrainosus: Secondary | ICD-10-CM

## 2015-07-23 NOTE — Progress Notes (Signed)
Botox- 200 units total Lot: [id number] Expiration: 12/2017 [id number]  Sodium Chloride 0.9% - 4mL total [id number] Expiration: 08/2016 NDC: 08657-846-96

## 2015-07-23 NOTE — Progress Notes (Signed)
Consent Form Botulism Toxin Injection For Chronic Migraine    Provider: Dr Lucia Gaskins Referring Provider: No ref. provider found Primary Care Physician: No primary care provider on file.  CC: migraines  HPI: Tracy Holder is a 49 y.o. female here for Migraines and sinus headaches.Today we are performing a second botocx injection however the first was over a year ago so we are starting all over again. She did receive benefit from her first botox injections.  She has a history of migraines. They have been going on for years. The past year they are worse. At least 4 days a week for the last year, they last 6-12 hours a day sometimes 24 hours. They are debilitating 10/10 pain. Imitrex helps but doesn't totally take away the pain 30-45 minutes. Left side, throbbing, whole head with pressure, +light sensitivity, +sound sensitivity. Has to go into a dark room. +nausea. She does not take OTC medications more than 2 days per week or 2x a day. She is on Lexapro. She has tried and failed Topamax, cyclobenzaprine, fluoxetine. Botox has helped but she still continues to have migraines.no auta. They last up to 24 hours a day. More than 16 migraines a month.   Botulism toxin has been approved by the Federal drug administration for treatment of chronic migraine. Botulism toxin does not cure chronic migraine and it may not be effective in some patients.  The administration of botulism toxin is accomplished by injecting a small amount of toxin into the muscles of the neck and head. Dosage must be titrated for each individual. Any benefits resulting from botulism toxin tend to wear off after 3 months with a repeat injection required if benefit is to be maintained. Injections are usually done every 3-4 months with maximum effect peak achieved by about 2 or 3 weeks. Botulism toxin is expensive and you should be sure of what costs you will incur resulting from the injection.  The side effects of botulism toxin use  for chronic migraine may include:   -Transient, and usually mild, facial weakness with facial injections  -Transient, and usually mild, head or neck weakness with head/neck injections  -Reduction or loss of forehead facial animation due to forehead muscle              weakness  -Eyelid drooping  -Dry eye  -Pain at the site of injection or bruising at the site of injection  -Double vision  -Potential unknown long term risks  Contraindications: You should not have Botox if you are pregnant, nursing, allergic to albumin, have an infection, skin condition, or muscle weakness at the site of the injection, or have myasthenia gravis, Lambert-Eaton syndrome, or ALS.  It is also possible that as with any injection, there may be an allergic reaction or no effect from the medication. Reduced effectiveness after repeated injections is sometimes seen and rarely infection at the injection site may occur. All care will be taken to prevent these side effects. If therapy is given over a long time, atrophy and wasting in the muscle injected may occur. Occasionally the patient's become refractory to treatment because they develop antibodies to the toxin. In this event, therapy needs to be modified.  I have read the above information and consent to the administration of botulism toxin.  On file  ______________  _____   _________________  Patient signature     Date   Witness signature       BOTOX PROCEDURE NOTE FOR MIGRAINE HEADACHE    Contraindications  and precautions discussed with patient(above). Aseptic procedure was observed and patient tolerated procedure. Procedure performed by Dr. Artemio Aly  The condition has existed for more than 6 months, and pt does not have a diagnosis of ALS, Myasthenia Gravis or Lambert-Eaton Syndrome. Risks and benefits of injections discussed and pt agrees to proceed with the procedure. Written consent obtained  These injections are medically necessary. He receives  good benefits from these injections. These injections do not cause sedations or hallucinations which the oral therapies may cause.  Indication/Diagnosis: chronic migraine BOTOX(J0585) injection was performed according to protocol by [employer]. 200 units of BOTOX was dissolved into 4 cc NS.  NDC: [address]-40  Type of toxin: Botox  Botox- 200 units total  Lot: [id number]  Expiration: 12/2017  [id number]    Description of procedure:  The patient was placed in a sitting position. The standard protocol was used for Botox as follows, with 5 units of Botox injected at each site:   -Procerus muscle, midline injection  -Corrugator muscle, bilateral injection  -Frontalis muscle, bilateral injection, with 2 sites each side, medial injection was performed in the upper one third of the frontalis muscle, in the region vertical from the medial inferior edge of the superior orbital rim. The lateral injection was again in the upper one third of the forehead vertically above the lateral limbus of the cornea, 1.5 cm lateral to the medial injection site.  -Temporalis muscle injection, 4 sites, bilaterally. The first injection was 3 cm above the tragus of the ear, second injection site was 1.5 cm to 3 cm up from the first injection site in line with the tragus of the ear. The third injection site was 1.5-3 cm forward between the first 2 injection sites. The fourth injection site was 1.5 cm posterior to the second injection site.  -Occipitalis muscle injection, 3 sites, bilaterally. The first injection was done one half way between the occipital protuberance and the tip of the mastoid process behind the ear. The second injection site was done lateral and superior to the first, 1 fingerbreadth from the first injection. The third injection site was 1 fingerbreadth superiorly and medially from the first injection site.  -Cervical paraspinal muscle injection, 2 sites, bilateral knee first injection site was 1 cm  from the midline of the cervical spine, 3 cm inferior to the lower border of the occipital protuberance. The second injection site was 1.5 cm superiorly and laterally to the first injection site.  -Trapezius muscle injection was performed at 3 sites, bilaterally. The first injection site was in the upper trapezius muscle halfway between the inflection point of the neck, and the acromion. The second injection site was one half way between the acromion and the first injection site. The third injection was done between the first injection site and the inflection point of the neck.   Will return for repeat injection in 3 months.   A 200 unit sof Botox was used, 155 units were injected, the rest of the Botox was wasted. The patient tolerated the procedure well, there were no complications of the above procedure.

## 2015-08-01 ENCOUNTER — Telehealth: Payer: Self-pay | Admitting: Neurology

## 2015-08-01 NOTE — Telephone Encounter (Signed)
Pt called requesting to know if migraine seems to start form allergy sinus pressure, could Dr Lucia Gaskins call in RX for Zyrtec D as pharmacist recommended this so it does not limit how many she can purchase. Please call and advise at [phone]

## 2015-08-04 ENCOUNTER — Other Ambulatory Visit: Payer: Self-pay | Admitting: Neurology

## 2015-08-04 NOTE — Telephone Encounter (Signed)
LVM for pt to call back regarding rx needed for Zyrtec-D. Asked her how many she wants monthly per Dr. Lucia Gaskins request. Apologized we were out of the office on Friday. Dr Lucia Gaskins is not here today and tomorrow due to family emergency. Gave [facility] phone number.

## 2015-08-04 NOTE — Telephone Encounter (Signed)
Tracy Holder,  How many Zyrtec-D monthly would she like? Would you call and ask her and apologize since I am out of the office? Thanks.

## 2015-08-05 NOTE — Telephone Encounter (Signed)
LVM for pt to call back regarding Rx for Zyrtex-D. Gave [facility] phone number.

## 2015-08-11 ENCOUNTER — Other Ambulatory Visit: Payer: Self-pay | Admitting: Neurology

## 2015-08-11 MED ORDER — CETIRIZINE-PSEUDOEPHEDRINE ER 5-120 MG PO TB12: 1.0000 | ORAL_TABLET | Freq: Every day | ORAL | Status: DC

## 2015-08-11 NOTE — Telephone Encounter (Signed)
LVM for pt spouse (okay per DPR) to have pt call back because pt VM was full and I could not leave a message. Gave [facility] phone number.

## 2015-08-11 NOTE — Telephone Encounter (Signed)
Tracy Holder - I sent in the script as she requested. However I do not recommend long-term daily zyrtec- D due to side effects. Please let there know I would prefer if she uses it only as needed, thanks

## 2015-08-11 NOTE — Telephone Encounter (Signed)
Pt called returning Tracy Holder's call. Pt states she takes Zyrtex-D 1 every night.

## 2015-08-11 NOTE — Telephone Encounter (Signed)
Tried calling pt back to relay message from Dr. Lucia Gaskins. Unable to leave message because VM inbox was full.

## 2015-08-28 NOTE — Telephone Encounter (Signed)
Error

## 2015-10-06 ENCOUNTER — Telehealth: Payer: Self-pay | Admitting: Neurology

## 2015-10-06 NOTE — Telephone Encounter (Signed)
Called Patient could not leave a message her voice mail full . [facility]  has been trying to get a hold of her to ship Botox Patient needs to call [facility] and give consent [phone].

## 2015-10-14 ENCOUNTER — Encounter: Payer: Self-pay | Admitting: Neurology

## 2015-10-14 ENCOUNTER — Telehealth: Payer: Self-pay | Admitting: Neurology

## 2015-10-14 NOTE — Telephone Encounter (Signed)
Called [facility] to see if patient had giving consent she had not yet I have left patient another message to call [facility] or to call me back Patient's apt 12/15 with Dr.Ahern.

## 2015-10-14 NOTE — Telephone Encounter (Signed)
That's appropriate, thank you !!!

## 2015-10-14 NOTE — Telephone Encounter (Signed)
Called patient and left her a message x 2 about calling [facility] she needs to give consent . Patient needs to call [phone]. I have relayed to patient we can't get Botox shipped unless she gives consent.

## 2015-10-14 NOTE — Telephone Encounter (Signed)
Called patient back x 4 asking her to please call special pharmacy. I called her husband as well x 2 and left a message. I had CX her Botox apt. Patient has to give consent before Botox   can have  be shipped. When patient calls back I will get her another apt with Dr. Lucia GaskinsAhern for her Botox I will send patient a letter as well.

## 2015-10-16 ENCOUNTER — Ambulatory Visit (INDEPENDENT_AMBULATORY_CARE_PROVIDER_SITE_OTHER): Payer: BLUE CROSS/BLUE SHIELD | Admitting: Neurology

## 2015-10-16 ENCOUNTER — Encounter: Payer: Self-pay | Admitting: Neurology

## 2015-10-16 ENCOUNTER — Ambulatory Visit: Payer: BLUE CROSS/BLUE SHIELD | Admitting: Neurology

## 2015-10-16 VITALS — BP 118/75 | HR 76 | Ht 62.5 in | Wt 133.0 lb

## 2015-10-16 DIAGNOSIS — G43701 Chronic migraine without aura, not intractable, with status migrainosus: Secondary | ICD-10-CM

## 2015-10-16 DIAGNOSIS — G43719 Chronic migraine without aura, intractable, without status migrainosus: Secondary | ICD-10-CM | POA: Diagnosis not present

## 2015-10-16 NOTE — Progress Notes (Signed)
Her migraines are extremely improved. She has reduced to 2-3 a month at the most.       Consent Form Botulism Toxin Injection For Chronic Migraine  Botulism toxin has been approved by the Federal drug administration for treatment of chronic migraine. Botulism toxin does not cure chronic migraine and it may not be effective in some patients.  The administration of botulism toxin is accomplished by injecting a small amount of toxin into the muscles of the neck and head. Dosage must be titrated for each individual. Any benefits resulting from botulism toxin tend to wear off after 3 months with a repeat injection required if benefit is to be maintained. Injections are usually done every 3-4 months with maximum effect peak achieved by about 2 or 3 weeks. Botulism toxin is expensive and you should be sure of what costs you will incur resulting from the injection.  The side effects of botulism toxin use for chronic migraine may include:   -Transient, and usually mild, facial weakness with facial injections  -Transient, and usually mild, head or neck weakness with head/neck injections  -Reduction or loss of forehead facial animation due to forehead muscle              weakness  -Eyelid drooping  -Dry eye  -Pain at the site of injection or bruising at the site of injection  -Double vision  -Potential unknown long term risks  Contraindications: You should not have Botox if you are pregnant, nursing, allergic to albumin, have an infection, skin condition, or muscle weakness at the site of the injection, or have myasthenia gravis, Lambert-Eaton syndrome, or ALS.  It is also possible that as with any injection, there may be an allergic reaction or no effect from the medication. Reduced effectiveness after repeated injections is sometimes seen and rarely infection at the injection site may occur. All care will be taken to prevent these side effects. If therapy is given over a long time, atrophy and  wasting in the muscle injected may occur. Occasionally the patient's become refractory to treatment because they develop antibodies to the toxin. In this event, therapy needs to be modified.  I have read the above information and consent to the administration of botulism toxin.  On file  ______________  _____   _________________  Patient signature     Date   Witness signature       BOTOX PROCEDURE NOTE FOR MIGRAINE HEADACHE    Contraindications and precautions discussed with patient(above). Aseptic procedure was observed and patient tolerated procedure. Procedure performed by Dr. Artemio Aly  The condition has existed for more than 6 months, and pt does not have a diagnosis of ALS, Myasthenia Gravis or Lambert-Eaton Syndrome. Risks and benefits of injections discussed and pt agrees to proceed with the procedure. Written consent obtained  These injections are medically necessary. He receives good benefits from these injections. These injections do not cause sedations or hallucinations which the oral therapies may cause.  Indication/Diagnosis: chronic migraine BOTOX(J0585) injection was performed according to protocol by [employer]. 200 units of BOTOX was dissolved into 4 cc NS.  NDC: [address]-40 Botox- 2- 100unit vials (200units total)  Lot: [id number]  Expiration: August 2019  [id number]  0.9% Sodium chloride- 4mL total  Lot: [id number]  Expiration: 08/2016  NDC: 96295-284-13    Description of procedure:  The patient was placed in a sitting position. The standard protocol was used for Botox as follows, with 5 units of Botox injected at each site:   -  Procerus muscle, midline injection  -Corrugator muscle, bilateral injection  -Frontalis muscle, bilateral injection, with 2 sites each side, medial injection was performed in the upper one third of the frontalis muscle, in the region vertical from the medial inferior edge of the superior orbital rim. The lateral injection was  again in the upper one third of the forehead vertically above the lateral limbus of the cornea, 1.5 cm lateral to the medial injection site.  -Temporalis muscle injection, 4 sites, bilaterally. The first injection was 3 cm above the tragus of the ear, second injection site was 1.5 cm to 3 cm up from the first injection site in line with the tragus of the ear. The third injection site was 1.5-3 cm forward between the first 2 injection sites. The fourth injection site was 1.5 cm posterior to the second injection site.  -Occipitalis muscle injection, 3 sites, bilaterally. The first injection was done one half way between the occipital protuberance and the tip of the mastoid process behind the ear. The second injection site was done lateral and superior to the first, 1 fingerbreadth from the first injection. The third injection site was 1 fingerbreadth superiorly and medially from the first injection site.  -Cervical paraspinal muscle injection, 2 sites, bilateral knee first injection site was 1 cm from the midline of the cervical spine, 3 cm inferior to the lower border of the occipital protuberance. The second injection site was 1.5 cm superiorly and laterally to the first injection site.  -Trapezius muscle injection was performed at 3 sites, bilaterally. The first injection site was in the upper trapezius muscle halfway between the inflection point of the neck, and the acromion. The second injection site was one half way between the acromion and the first injection site. The third injection was done between the first injection site and the inflection point of the neck.   Will return for repeat injection in 3 months.   200 units of Botox was used, 155 units were injected, the rest of the Botox was wasted. The patient tolerated the procedure well, there were no complications of the above procedure.

## 2015-10-16 NOTE — Progress Notes (Signed)
Botox- 2- 100unit vials (200units total) Lot: [id number] Expiration: August 2019 [id number]  0.9% Sodium chloride- 4mL total Lot: [id number] Expiration: 08/2016 NDC: 93235-573-2263323-186-20

## 2015-10-21 ENCOUNTER — Ambulatory Visit: Payer: BLUE CROSS/BLUE SHIELD | Admitting: Neurology

## 2015-11-04 NOTE — Telephone Encounter (Signed)
Called patient to schedule next injection. She did not answer,I left a VM asking her to return my call.

## 2015-11-19 NOTE — Telephone Encounter (Signed)
Patient returned Danielle's call °

## 2015-11-20 NOTE — Telephone Encounter (Signed)
Returned call. Left a VM asking the patient to call me back.

## 2015-12-11 NOTE — Telephone Encounter (Signed)
[employer] [id number] Self [phone].

## 2015-12-15 ENCOUNTER — Encounter: Payer: Self-pay | Admitting: *Deleted

## 2015-12-15 ENCOUNTER — Ambulatory Visit: Payer: BLUE CROSS/BLUE SHIELD | Admitting: Neurology

## 2015-12-15 NOTE — Progress Notes (Signed)
Faxed 90 day refill request x4 refills back to pharmacy. Received confirmation.

## 2016-01-07 NOTE — Telephone Encounter (Signed)
Called patient to inform her that medication was ready for delivery but awaiting her consent. Left a VM with the pharmacies phone number asking her to give them a call.

## 2016-01-15 ENCOUNTER — Encounter: Payer: Self-pay | Admitting: Neurology

## 2016-01-15 ENCOUNTER — Ambulatory Visit (INDEPENDENT_AMBULATORY_CARE_PROVIDER_SITE_OTHER): Payer: BLUE CROSS/BLUE SHIELD | Admitting: Neurology

## 2016-01-15 VITALS — BP 109/72 | HR 95 | Ht 62.5 in

## 2016-01-15 DIAGNOSIS — G43719 Chronic migraine without aura, intractable, without status migrainosus: Secondary | ICD-10-CM | POA: Diagnosis not present

## 2016-01-15 NOTE — Progress Notes (Signed)
Botox-100unitsx2 vials Lot: [id number] Expiration: 07/2018 [id number]  0.9% Sodium Chloride- 4mL total [id number] Expiration: 08/2016 NDC: 08657-846-9663323-186-20

## 2016-01-19 DIAGNOSIS — G43719 Chronic migraine without aura, intractable, without status migrainosus: Secondary | ICD-10-CM | POA: Insufficient documentation

## 2016-01-19 NOTE — Progress Notes (Signed)

## 2016-03-03 ENCOUNTER — Other Ambulatory Visit: Payer: Self-pay | Admitting: Neurology

## 2016-03-24 ENCOUNTER — Other Ambulatory Visit: Payer: Self-pay | Admitting: Neurology

## 2016-04-22 ENCOUNTER — Ambulatory Visit (INDEPENDENT_AMBULATORY_CARE_PROVIDER_SITE_OTHER): Payer: BLUE CROSS/BLUE SHIELD | Admitting: Neurology

## 2016-04-22 ENCOUNTER — Encounter: Payer: Self-pay | Admitting: Neurology

## 2016-04-22 ENCOUNTER — Encounter: Payer: Self-pay | Admitting: *Deleted

## 2016-04-22 VITALS — BP 117/80 | HR 74 | Ht 62.5 in

## 2016-04-22 DIAGNOSIS — G43701 Chronic migraine without aura, not intractable, with status migrainosus: Secondary | ICD-10-CM | POA: Diagnosis not present

## 2016-04-22 NOTE — Progress Notes (Signed)
Consent Form Botulism Toxin Injection For Chronic Migraine  Reviewed before every procedure: Botulism toxin has been approved by the Federal drug administration for treatment of chronic migraine. Botulism toxin does not cure chronic migraine and it may not be effective in some patients.  The administration of botulism toxin is accomplished by injecting a small amount of toxin into the muscles of the neck and head. Dosage must be titrated for each individual. Any benefits resulting from botulism toxin tend to wear off after 3 months with a repeat injection required if benefit is to be maintained. Injections are usually done every 3-4 months with maximum effect peak achieved by about 2 or 3 weeks. Botulism toxin is expensive and you should be sure of what costs you will incur resulting from the injection.  The side effects of botulism toxin use for chronic migraine may include:   -Transient, and usually mild, facial weakness with facial injections  -Transient, and usually mild, head or neck weakness with head/neck injections  -Reduction or loss of forehead facial animation due to forehead muscle  weakness  -Eyelid drooping  -Dry eye  -Pain at the site of injection or bruising at the site of injection  -Double vision  -Potential unknown long term risks  Contraindications: You should not have Botox if you are pregnant, nursing, allergic to albumin, have an infection, skin condition, or muscle weakness at the site of the injection, or have myasthenia gravis, Lambert-Eaton syndrome, or ALS.  It is also possible that as with any injection, there may be an allergic reaction or no effect from the medication. Reduced effectiveness after repeated injections is sometimes seen and rarely infection at the injection site may occur. All care will be taken to prevent these side effects. If therapy is given over a long time, atrophy and wasting in the muscle injected may occur. Occasionally the patient's  become refractory to treatment because they develop antibodies to the toxin. In this event, therapy needs to be modified.  I have read the above information and consent to the administration of botulism toxin.    Patient acknowledged risk.      BOTOX PROCEDURE NOTE FOR MIGRAINE HEADACHE    Contraindications and precautions discussed with patient(above). Aseptic procedure was observed and patient tolerated procedure. Procedure performed by Dr. Artemio Alyoni Jackline Castilla  The condition has existed for more than 6 months, and pt does not have a diagnosis of ALS, Myasthenia Gravis or Lambert-Eaton Syndrome. Risks and benefits of injections discussed and pt agrees to proceed with the procedure. Written consent obtained  These injections are medically necessary. He receives good benefits from these injections. These injections do not cause sedations or hallucinations which the oral therapies may cause.  Indication/Diagnosis: chronic migraine BOTOX(J0585) injection was performed according to protocol by [facility]. 200 units of BOTOX was dissolved into 4 cc NS.  NDC: [address]-4000023-1145-01  Type of toxin: Botox  Lot # [id number] EXP: 11/2018   Description of procedure:  The patient was placed in a sitting position. The standard protocol was used for Botox as follows, with 5 units of Botox injected at each site:   -Procerus muscle, midline injection  -Corrugator muscle, bilateral injection  -Frontalis muscle, bilateral injection, with 2 sites each side, medial injection was performed in the upper one third of the frontalis muscle, in the region vertical from the medial inferior edge of the superior orbital rim. The lateral injection was again in the upper one third of the forehead vertically above the lateral limbus of  the cornea, 1.5 cm lateral to the medial injection site.  -Temporalis muscle injection, 4 sites, bilaterally. The first injection was 3 cm above the tragus of the ear, second injection site was  1.5 cm to 3 cm up from the first injection site in line with the tragus of the ear. The third injection site was 1.5-3 cm forward between the first 2 injection sites. The fourth injection site was 1.5 cm posterior to the second injection site.  -Occipitalis muscle injection, 3 sites, bilaterally. The first injection was done one half way between the occipital protuberance and the tip of the mastoid process behind the ear. The second injection site was done lateral and superior to the first, 1 fingerbreadth from the first injection. The third injection site was 1 fingerbreadth superiorly and medially from the first injection site.  -Cervical paraspinal muscle injection, 2 sites, bilateral knee first injection site was 1 cm from the midline of the cervical spine, 3 cm inferior to the lower border of the occipital protuberance. The second injection site was 1.5 cm superiorly and laterally to the first injection site.  -Trapezius muscle injection was performed at 3 sites, bilaterally. The first injection site was in the upper trapezius muscle halfway between the inflection point of the neck, and the acromion. The second injection site was one half way between the acromion and the first injection site. The third injection was done between the first injection site and the inflection point of the neck.   Will return for repeat injection in 3 months.   A 200 unit sof Botox was used, 155 units were injected, the rest of the Botox was wasted. The patient tolerated the procedure well, there were no complications of the above procedure.

## 2016-04-22 NOTE — Progress Notes (Signed)
Botox-100unitsx2 vials Lot: [id number] Expiration: 11/2018 [id number]  0.9% Sodium Chloride- 4mL total [id number] Expiration: 09/2017 NDC: [id number]

## 2016-04-22 NOTE — Progress Notes (Signed)
Faxed enrollment form for zembrace to [facility]. Fax: [phone]. Received fax confirmation.

## 2016-04-26 NOTE — Telephone Encounter (Signed)
Called and left patient a message with the pharmacy's phone number [phone].

## 2016-07-08 ENCOUNTER — Other Ambulatory Visit: Payer: Self-pay | Admitting: Neurology

## 2016-07-15 ENCOUNTER — Telehealth: Payer: Self-pay | Admitting: Neurology

## 2016-07-15 NOTE — Telephone Encounter (Signed)
Pt called said she rec'd a call and was told to expect a call from her insurance company reg the botox. She said the last time there were problems getting PA. She said she would call back  Her appt is 9/21

## 2016-07-16 NOTE — Telephone Encounter (Signed)
Returned Kashari's call and left a VM asking her to call me back.

## 2016-07-22 ENCOUNTER — Encounter: Payer: Self-pay | Admitting: Neurology

## 2016-07-22 ENCOUNTER — Ambulatory Visit (INDEPENDENT_AMBULATORY_CARE_PROVIDER_SITE_OTHER): Payer: BLUE CROSS/BLUE SHIELD | Admitting: Neurology

## 2016-07-22 VITALS — BP 133/74 | HR 80

## 2016-07-22 DIAGNOSIS — G43701 Chronic migraine without aura, not intractable, with status migrainosus: Secondary | ICD-10-CM | POA: Diagnosis not present

## 2016-07-22 NOTE — Progress Notes (Signed)
Consent Form Botulism Toxin Injection For Chronic Migraine  Reviewed before every procedure: Botulism toxin has been approved by the Federal drug administration for treatment of chronic migraine. Botulism toxin does not cure chronic migraine and it may not be effective in some patients.  The administration of botulism toxin is accomplished by injecting a small amount of toxin into the muscles of the neck and head. Dosage must be titrated for each individual. Any benefits resulting from botulism toxin tend to wear off after 3 months with a repeat injection required if benefit is to be maintained. Injections are usually done every 3-4 months with maximum effect peak achieved by about 2 or 3 weeks. Botulism toxin is expensive and you should be sure of what costs you will incur resulting from the injection.  The side effects of botulism toxin use for chronic migraine may include:              -Transient, and usually mild, facial weakness with facial injections             -Transient, and usually mild, head or neck weakness with head/neck injections             -Reduction or loss of forehead facial animation due to forehead muscle  weakness             -Eyelid drooping             -Dry eye             -Pain at the site of injection or bruising at the site of injection             -Double vision             -Potential unknown long term risks  Contraindications: You should not have Botox if you are pregnant, nursing, allergic to albumin, have an infection, skin condition, or muscle weakness at the site of the injection, or have myasthenia gravis, Lambert-Eaton syndrome, or ALS.  It is also possible that as with any injection, there may be an allergic reaction or no effect from the medication. Reduced effectiveness after repeated injections is sometimes seen and rarely infection at the injection site may occur. All care will be taken to prevent these side effects. If therapy is given over a long  time, atrophy and wasting in the muscle injected may occur. Occasionally the patient's become refractory to treatment because they develop antibodies to the toxin. In this event, therapy needs to be modified.  I have read the above information and consent to the administration of botulism toxin.               Patient acknowledged risk.      BOTOX PROCEDURE NOTE FOR MIGRAINE HEADACHE    Contraindications and precautions discussed with patient(above). Aseptic procedure was observed and patient tolerated procedure. Procedure performed by Dr. Artemio Alyoni Haydon Kalmar  The condition has existed for more than 6 months, and pt does not have a diagnosis of ALS, Myasthenia Gravis or Lambert-Eaton Syndrome. Risks and benefits of injections discussed and pt agrees to proceed with the procedure. Written consent obtained  These injections are medically necessary. He receives good benefits from these injections. These injections do not cause sedations or hallucinations which the oral therapies may cause.  Indication/Diagnosis: chronic migraine BOTOX(J0585) injection was performed according to protocol by [facility]. 200 units of BOTOX was dissolved into 4 cc NS.  NDC: [address]-4000023-1145-01  Botox-100unitsx2 vials Lot: [id number] Expiration: 12/2018 NDC: [id number] [id number]  0.9%  Sodium Chloride- 4mL total [id number] Expiration: 03/2018 NDC: 09811-914-78   Description of procedure:  The patient was placed in a sitting position. The standard protocol was used for Botox as follows, with 5 units of Botox injected at each site:   -Procerus muscle, midline injection  -Corrugator muscle, bilateral injection  -Frontalis muscle, bilateral injection, with 2 sites each side, medial injection was performed in the upper one third of the frontalis muscle, in the region vertical from the medial inferior edge of the superior orbital rim. The lateral injection was again in the upper one third of the  forehead vertically above the lateral limbus of the cornea, 1.5 cm lateral to the medial injection site.  -Temporalis muscle injection, 4 sites, bilaterally. The first injection was 3 cm above the tragus of the ear, second injection site was 1.5 cm to 3 cm up from the first injection site in line with the tragus of the ear. The third injection site was 1.5-3 cm forward between the first 2 injection sites. The fourth injection site was 1.5 cm posterior to the second injection site.  -Occipitalis muscle injection, 3 sites, bilaterally. The first injection was done one half way between the occipital protuberance and the tip of the mastoid process behind the ear. The second injection site was done lateral and superior to the first, 1 fingerbreadth from the first injection. The third injection site was 1 fingerbreadth superiorly and medially from the first injection site.  -Cervical paraspinal muscle injection, 2 sites, bilateral knee first injection site was 1 cm from the midline of the cervical spine, 3 cm inferior to the lower border of the occipital protuberance. The second injection site was 1.5 cm superiorly and laterally to the first injection site.  -Trapezius muscle injection was performed at 3 sites, bilaterally. The first injection site was in the upper trapezius muscle halfway between the inflection point of the neck, and the acromion. The second injection site was one half way between the acromion and the first injection site. The third injection was done between the first injection site and the inflection point of the neck.   Will return for repeat injection in 3 months.   A 200 unit sof Botox was used, 155 units were injected, the rest of the Botox was wasted. The patient tolerated the procedure well, there were no complications of the above procedure.

## 2016-07-22 NOTE — Progress Notes (Signed)
Botox-100unitsx2 vials Lot: [id number] Expiration: 12/2018 NDC: 0223-1145-01 [id number]  0.9% Sodium Chloride- 4mL total [id number] Expiration: 03/2018 NDC: 63323-186-20 

## 2016-12-29 ENCOUNTER — Other Ambulatory Visit: Payer: Self-pay | Admitting: Neurology

## 2017-02-17 ENCOUNTER — Other Ambulatory Visit: Payer: Self-pay | Admitting: Neurology

## 2017-03-09 ENCOUNTER — Telehealth: Payer: Self-pay | Admitting: Neurology

## 2017-03-09 NOTE — Telephone Encounter (Signed)
Can you let me know if this patient owes a balance for their last injection?  °

## 2017-03-09 NOTE — Telephone Encounter (Signed)
Called [facility] and Patient's Botox Patient is approved [facility] is waiting for patient to call back to give consent.  Telephone [phone] .  Called and left her a voice mail to [facility]

## 2017-03-10 NOTE — Telephone Encounter (Signed)
Pt called back to inform that [facility] informed her to have Danielle to call them on tomorrow, pt has already given consent re: botox.. Pt said to call them on tomorrow because they not ship on Monday 5-14. They can be reached at [phone]

## 2017-03-15 NOTE — Telephone Encounter (Signed)
Noted  

## 2017-03-16 ENCOUNTER — Ambulatory Visit (INDEPENDENT_AMBULATORY_CARE_PROVIDER_SITE_OTHER): Payer: BLUE CROSS/BLUE SHIELD | Admitting: Neurology

## 2017-03-16 VITALS — BP 130/92 | HR 74 | Ht 62.5 in | Wt 129.6 lb

## 2017-03-16 DIAGNOSIS — G43701 Chronic migraine without aura, not intractable, with status migrainosus: Secondary | ICD-10-CM

## 2017-03-16 NOTE — Progress Notes (Signed)

## 2017-03-16 NOTE — Progress Notes (Signed)
Botox 100 units/vial x 2 [facility] [phone] Lot [id number] 11 2020  Diluted in 4 ml of Bacteriostatic 0.9% NaCl NDC [phone] Lot [id number]

## 2017-03-21 ENCOUNTER — Other Ambulatory Visit: Payer: Self-pay | Admitting: Neurology

## 2017-06-16 ENCOUNTER — Telehealth: Payer: Self-pay

## 2017-06-16 MED ORDER — SUMATRIPTAN SUCCINATE 3 MG/0.5ML ~~LOC~~ SOAJ: 3.0000 mg | Freq: Once | SUBCUTANEOUS | 11 refills | Status: DC | PRN

## 2017-06-16 NOTE — Telephone Encounter (Signed)
Zembrace refills e-scribed per faxed request from pharmacy.

## 2017-06-23 MED ORDER — SUMATRIPTAN SUCCINATE 3 MG/0.5ML ~~LOC~~ SOAJ: 3.0000 mg | Freq: Once | SUBCUTANEOUS | 3 refills | Status: DC | PRN

## 2017-06-23 NOTE — Telephone Encounter (Signed)
[facility] [phone] called the clinic pt is wanting 12 pens dispensed at at time as written with previous RX. It can be escribed or can take a VO.

## 2017-06-23 NOTE — Addendum Note (Signed)
Addended by: Hillis Range on: 06/23/2017 09:31 AM   Modules accepted: Orders

## 2017-06-23 NOTE — Telephone Encounter (Signed)
Faxed printed/signed rx zembrace for [facility] at [phone]. Received confirmation.

## 2017-06-23 NOTE — Telephone Encounter (Signed)
Printed rx. Awaiting signature from Dr Anne Hahn who is the [facility] this am. Dr Lucia Gaskins out of the office.

## 2017-06-27 ENCOUNTER — Telehealth: Payer: Self-pay | Admitting: Neurology

## 2017-06-27 NOTE — Telephone Encounter (Signed)
I called the patient to ask her a question regarding her auths. She did not answer so I left a VM asking her to call me back.

## 2017-06-27 NOTE — Telephone Encounter (Signed)
Patient called office returning Tracy Holder's call.  Per Duwayne Heck she asked that I ask the patient if she has had 50% reduction in headaches since starting Botox patient stated close to.  Per patient states she occasionally has some thru the week, but they have improved and have not gotten worse. She states there is a huge noticeable difference since getting Botox.  FYI

## 2017-06-29 ENCOUNTER — Encounter: Payer: Self-pay | Admitting: Neurology

## 2017-06-29 ENCOUNTER — Ambulatory Visit (INDEPENDENT_AMBULATORY_CARE_PROVIDER_SITE_OTHER): Payer: BLUE CROSS/BLUE SHIELD | Admitting: Neurology

## 2017-06-29 ENCOUNTER — Telehealth: Payer: Self-pay | Admitting: *Deleted

## 2017-06-29 VITALS — BP 112/72 | HR 77

## 2017-06-29 DIAGNOSIS — G43701 Chronic migraine without aura, not intractable, with status migrainosus: Secondary | ICD-10-CM | POA: Diagnosis not present

## 2017-06-29 MED ORDER — SUMATRIPTAN SUCCINATE 3 MG/0.5ML ~~LOC~~ SOAJ: 3.0000 mg | Freq: Once | SUBCUTANEOUS | 3 refills | Status: DC | PRN

## 2017-06-29 NOTE — Addendum Note (Signed)
Addended by: Hillis RangeKING, Margarette Vannatter L on: 06/29/2017 12:58 PM   Modules accepted: Orders

## 2017-06-29 NOTE — Telephone Encounter (Signed)
Faxed printed/signed rx to [facility]. Fax: [phone]. Received confirmation.

## 2017-06-29 NOTE — Progress Notes (Signed)
Botox-100unitsx2 vials Lot: [id number] Expiration: 12/2019 NDC: [id number] [id number]  0.9% Sodium Chloride bacteriostatic- 4mL total Lot: [id number] Expiration: 12/02/2018 NDC: [id number]  Dx: A63.016G43.701 B/B    Consent Form Botulism Toxin Injection For Chronic Migraine  Botulism toxin has been approved by the Federal drug administration for treatment of chronic migraine. Botulism toxin does not cure chronic migraine and it may not be effective in some patients.  The administration of botulism toxin is accomplished by injecting a small amount of toxin into the muscles of the neck and head. Dosage must be titrated for each individual. Any benefits resulting from botulism toxin tend to wear off after 3 months with a repeat injection required if benefit is to be maintained. Injections are usually done every 3-4 months with maximum effect peak achieved by about 2 or 3 weeks. Botulism toxin is expensive and you should be sure of what costs you will incur resulting from the injection.  The side effects of botulism toxin use for chronic migraine may include:   -Transient, and usually mild, facial weakness with facial injections  -Transient, and usually mild, head or neck weakness with head/neck injections  -Reduction or loss of forehead facial animation due to forehead muscle              weakness  -Eyelid drooping  -Dry eye  -Pain at the site of injection or bruising at the site of injection  -Double vision  -Potential unknown long term risks  Contraindications: You should not have Botox if you are pregnant, nursing, allergic to albumin, have an infection, skin condition, or muscle weakness at the site of the injection, or have myasthenia gravis, Lambert-Eaton syndrome, or ALS.  It is also possible that as with any injection, there may be an allergic reaction or no effect from the medication. Reduced effectiveness after repeated injections is sometimes seen and rarely infection at the  injection site may occur. All care will be taken to prevent these side effects. If therapy is given over a long time, atrophy and wasting in the muscle injected may occur. Occasionally the patient's become refractory to treatment because they develop antibodies to the toxin. In this event, therapy needs to be modified.  I have read the above information and consent to the administration of botulism toxin.    ______________  _____   _________________  Patient signature     Date   Witness signature       BOTOX PROCEDURE NOTE FOR MIGRAINE HEADACHE    Contraindications and precautions discussed with patient(above). Aseptic procedure was observed and patient tolerated procedure. Procedure performed by Dr. Artemio Alyoni Ahern  The condition has existed for more than 6 months, and pt does not have a diagnosis of ALS, Myasthenia Gravis or Lambert-Eaton Syndrome. Risks and benefits of injections discussed and pt agrees to proceed with the procedure. Written consent obtained  These injections are medically necessary. He receives good benefits from these injections. These injections do not cause sedations or hallucinations which the oral therapies may cause.  Indication/Diagnosis: chronic migraine BOTOX(J0585) injection was performed according to protocol by [employer]. 200 units of BOTOX was dissolved into 4 cc NS.  NDC: [id number]  Description of procedure:  The patient was placed in a sitting position. The standard protocol was used for Botox as follows, with 5 units of Botox injected at each site:   -Procerus muscle, midline injection  -Corrugator muscle, bilateral injection  -Frontalis muscle, bilateral injection, with 2 sites each side, medial injection was performed in the  upper one third of the frontalis muscle, in the region vertical from the medial inferior edge of the superior orbital rim. The lateral injection was again in the upper one third of the forehead vertically above the  lateral limbus of the cornea, 1.5 cm lateral to the medial injection site.  -Temporalis muscle injection, 4 sites, bilaterally. The first injection was 3 cm above the tragus of the ear, second injection site was 1.5 cm to 3 cm up from the first injection site in line with the tragus of the ear. The third injection site was 1.5-3 cm forward between the first 2 injection sites. The fourth injection site was 1.5 cm posterior to the second injection site.  -Occipitalis muscle injection, 3 sites, bilaterally. The first injection was done one half way between the occipital protuberance and the tip of the mastoid process behind the ear. The second injection site was done lateral and superior to the first, 1 fingerbreadth from the first injection. The third injection site was 1 fingerbreadth superiorly and medially from the first injection site.  -Cervical paraspinal muscle injection, 2 sites, bilateral knee first injection site was 1 cm from the midline of the cervical spine, 3 cm inferior to the lower border of the occipital protuberance. The second injection site was 1.5 cm superiorly and laterally to the first injection site.  -Trapezius muscle injection was performed at 3 sites, bilaterally. The first injection site was in the upper trapezius muscle halfway between the inflection point of the neck, and the acromion. The second injection site was one half way between the acromion and the first injection site. The third injection was done between the first injection site and the inflection point of the neck.   Will return for repeat injection in 3 months.   A 200 unit sof Botox was used, 155 units were injected, the rest of the Botox was wasted. The patient tolerated the procedure well, there were no complications of the above procedure.

## 2017-11-02 ENCOUNTER — Telehealth: Payer: Self-pay | Admitting: Neurology

## 2017-11-02 NOTE — Telephone Encounter (Signed)
I called the patient to make sure her insurance information had not changed with the new year. She did not answer so I left a VM asking her to call back, if she calls back and I am not available would you please ask if her insurance changed and if so get the new information? Thank you.

## 2017-11-07 ENCOUNTER — Telehealth: Payer: Self-pay | Admitting: Neurology

## 2017-11-07 NOTE — Telephone Encounter (Signed)
Pt called she has new ins effective 09/30/2017: [employer] Member ID# [id number] Pt said this does not have RX coverage. Please call to touch base with her

## 2017-11-07 NOTE — Telephone Encounter (Signed)
The speciality pharmacy reached out via fax to make me aware there was no insurance on file. I called their office and was told her old policy is now inactive. I called the patient again to try and obtain the new insurance information but she did not answer. I left her a voicemail making her aware the I was cancelling her apt until I hear back from her.

## 2017-11-08 NOTE — Telephone Encounter (Signed)
I returned patient call to discuss the insurance, she did not answer so I left a VM asking her to call me back.

## 2017-11-09 ENCOUNTER — Ambulatory Visit: Payer: BLUE CROSS/BLUE SHIELD | Admitting: Neurology

## 2017-12-29 ENCOUNTER — Telehealth: Payer: Self-pay | Admitting: Neurology

## 2017-12-29 NOTE — Telephone Encounter (Signed)
I called and spoke with the patient regarding the botox savings program. Her migraines are coming back and she would really like to get back on her injections before she gets her new insurance. She is going to call and speak with the savings program.

## 2017-12-29 NOTE — Telephone Encounter (Signed)
Pt requesting a call back to discuss how she is [employer] [place] [employer] insurance and had spoke with Duwayne HeckDanielle about something that partially cover Botox. Please call to discuss

## 2018-02-22 ENCOUNTER — Other Ambulatory Visit: Payer: Self-pay | Admitting: Neurology

## 2018-06-09 ENCOUNTER — Other Ambulatory Visit: Payer: Self-pay | Admitting: Neurology

## 2018-07-19 ENCOUNTER — Telehealth: Payer: Self-pay | Admitting: Neurology

## 2018-07-19 ENCOUNTER — Other Ambulatory Visit: Payer: Self-pay | Admitting: Neurology

## 2018-07-19 MED ORDER — SUMATRIPTAN SUCCINATE 100 MG PO TABS: ORAL_TABLET | ORAL | 2 refills | Status: DC

## 2018-07-19 NOTE — Telephone Encounter (Addendum)
Spoke with patient. I scheduled her for a yearly f/u appt, gave options, pt requested later due to insurance changes. She chose Wed Nov 13th @ 2:00. She is aware arrival time 1:30. Also discussed pt needs to be seen yearly in order to continue getting refills. Pt aware that we will refill her sumatriptan 100 mg tablet to get her through her Nov appt. Pt appreciative and had no further questions.   Sumatriptan 100 mg tablet refilled.

## 2018-07-19 NOTE — Telephone Encounter (Signed)
[facility] spoke with Riki RuskJeremy and canceled the Sumatriptan. Refilled Sumatriptan 100 mg to pt's requested pharmacy, [place].

## 2018-07-19 NOTE — Addendum Note (Signed)
Addended by: Bertram SavinULBERTSON, BETHANY L on: 07/19/2018 10:08 AM   Modules accepted: Orders

## 2018-07-19 NOTE — Telephone Encounter (Signed)
Pt leaving out of town, she is asking for refill on her SUMAtriptan (IMITREX) 100 MG tablet Pharmacy    [facility] [place] - [address] [phone] ([phone] (Fax)   Told pt that due to her new insurance she needs to call Dr for a new prescription to be sent in.  Pt is down to 1 pill and is going out of town on Friday, she would like this to be done for her before she leaves out of town on Friday, please call if there is a problem with this request

## 2018-08-30 ENCOUNTER — Other Ambulatory Visit: Payer: Self-pay | Admitting: Obstetrics and Gynecology

## 2018-08-30 DIAGNOSIS — Z1231 Encounter for screening mammogram for malignant neoplasm of breast: Secondary | ICD-10-CM

## 2018-09-13 ENCOUNTER — Encounter: Payer: Self-pay | Admitting: Neurology

## 2018-09-13 ENCOUNTER — Ambulatory Visit (INDEPENDENT_AMBULATORY_CARE_PROVIDER_SITE_OTHER): Payer: Managed Care, Other (non HMO) | Admitting: Neurology

## 2018-09-13 VITALS — BP 123/85 | HR 91 | Ht 62.5 in | Wt 130.0 lb

## 2018-09-13 DIAGNOSIS — G43709 Chronic migraine without aura, not intractable, without status migrainosus: Secondary | ICD-10-CM

## 2018-09-13 MED ORDER — SUMATRIPTAN 10 MG/ACT NA SOLN: 10.0000 mg | NASAL | 0 refills | Status: DC | PRN

## 2018-09-13 MED ORDER — ERENUMAB-AOOE 140 MG/ML ~~LOC~~ SOAJ: 140.0000 mg | SUBCUTANEOUS | 11 refills | Status: DC

## 2018-09-13 MED ORDER — SUMATRIPTAN SUCCINATE 3 MG/0.5ML ~~LOC~~ SOAJ: 3.0000 mg | Freq: Once | SUBCUTANEOUS | 6 refills | Status: DC | PRN

## 2018-09-13 NOTE — Progress Notes (Signed)
[facility]    Provider:  Dr Lucia Gaskins Referring Provider: Farris Has, MD Primary Care Physician:  Farris Has, MD  CC:  Migraines  Interval history 09/13/2018: She takes imitrex po, injections worked great a year ago. She has 20 headaches days a month and 10 of those are migrainous > 6months. No aura. No medication overuse. Tried Botox n the past and it worked but Health visitor it. She has tried and failed multiple medications. Will try Aimovig. Botox would be much preferable she did excellent on botox.   She has tried and failed Topamax, Lexapro, cyclobenzaprine, fluoxetine. Wellbutrin. Neurontin. Zoloft.  Propranolol.   HPI:  Tracy Holder is a 52 y.o. female here as a referral from Dr. Kateri Plummer for Trigeminal Neuralgia  Left facial numbness and tingling been going on for 6 months. Just started one day, no inciting factors. Went to the ENT because she thought it was sinus related. ENT ruled out any sinus infections. She has numbness on the left side of the face around the eye and cheek to the left ear, sometimes tingly. Continuous, waxes and wanes as far as the severity. Unknown triggers or what makes it better or worse. Her left eye really bothers her like pressure. No shooting pains, no electric, shock-like or stabbing pains. She was scoped by ENT and she was given an antibiotic which significantly helped but 2 weeks afterwards the symptoms came back. The symptoms can become 8-9/10 severe pain. She gets a heaviness in left face, puffiness. She has some neck pain. No other focal or otherwise neurologic symptoms. Allergy meds didn't help.   She has a history of migraines. They have been going on for years. The past year they are worse. At least 5 days a week for the last year, they last 6-12 hours a day sometimes longer. They are debilitating 10/10 pain. Imitrex helps but doesn't totally take away the pain. Getting worse. Left side, throbbing, whole head with  pressure, +light sensitivity, +sound sensitivity. Has to go into a dark room. +nausea. She does not take OTC medications more than 2 days per week or 2x a day. She is on Lexapro.  She has tried and failed Topamax, cyclobenzaprine, fluoxetine.   Shingles 10 years ago in the same left face area. But these symptoms didn't start until 6 months ago.  Reviewed notes, labs and imaging from outside physicians, which showed: Personally reviewed MRi of the brain, no large ischemic strokes, no tumors or masses, normal architecture, nothing in the pons area or cp angle compressing CN5. ENT notes stae that endoscopy did not show etiology for symptoms. Notes state symptoms for 1.5 years.   Review of Systems: Patient complains of symptoms per HPI as well as the following symptoms: eye pain, ringing in ears, numbness. Pertinent negatives per HPI. All others negative.   Social History   Socioeconomic History  . Marital status: Married    Spouse name: Augusto Gamble   . Number of children: 2  . Years of education: MASTERS  . Highest education level: Not on file  Occupational History  . Not on file  Social Needs  . Financial resource strain: Not on file  . Food insecurity:    Worry: Not on file    Inability: Not on file  . Transportation needs:    Medical: Not on file    Non-medical: Not on file  Tobacco Use  . Smoking status: Never Smoker  . Smokeless tobacco: Never Used  Substance and Sexual  Activity  . Alcohol use: Yes    Comment: 1-2 WEEK  . Drug use: No  . Sexual activity: Not on file  Lifestyle  . Physical activity:    Days per week: Not on file    Minutes per session: Not on file  . Stress: Not on file  Relationships  . Social connections:    Talks on phone: Not on file    Gets together: Not on file    Attends religious service: Not on file    Active member of club or organization: Not on file    Attends meetings of clubs or organizations: Not on file    Relationship status: Not on file  .  Intimate partner violence:    Fear of current or ex partner: Not on file    Emotionally abused: Not on file    Physically abused: Not on file    Forced sexual activity: Not on file  Other Topics Concern  . Not on file  Social History Narrative   Patient lives at home with husband   Patient is right handed.    Patient has 2 children     Family History  Problem Relation Age of Onset  . Migraines Neg Hx     Past Medical History:  Diagnosis Date  . Migraine     Past Surgical History:  Procedure Laterality Date  . BACK SURGERY     HARRINGTON ROD    Current Outpatient Medications  Medication Sig Dispense Refill  . BOTOX 100 units SOLR injection INJECT 155 UNITS INTRAMUSCULARLY TO FACIAL AND NECK BY PHYSICIAN EVERY 3 MONTHS 2 vial 3  . buPROPion (WELLBUTRIN XL) 150 MG 24 hr tablet Take 150 mg by mouth daily.   0  . [facility] ALLERGY RELIEF-D 5-120 MG tablet TAKE 1 TABLET BY MOUTH EVERY DAY 30 tablet 5  . fluticasone (FLONASE) 50 MCG/ACT nasal spray Place 1 spray into both nostrils as needed.  5  . SUMAtriptan (IMITREX) 100 MG tablet TAKE 1 TABLET AT START OF HEADACHE. MAY REPEAT IN 2HRS. MAX OF 2 TABS/DAY AND 2 DAYS/WEEK. 10 tablet 2  . SUMAtriptan Succinate (ZEMBRACE SYMTOUCH) 3 MG/0.5ML SOAJ Inject 3 mg into the skin once as needed. May repeat up to 4x daily at least 1 hr apart. Max 12 mg (4 inj) in 1 day. 12 pens for 90 day supply (Patient not taking: Reported on 09/13/2018) 12 pen 3   No current facility-administered medications for this visit.     Allergies as of 09/13/2018  . (No Known Allergies)    Vitals: BP 123/85   Pulse 91   Ht 5' 2.5" (1.588 m)   Wt 130 lb (59 kg)   BMI 23.40 kg/m  Last Weight:  Wt Readings from Last 1 Encounters:  09/13/18 130 lb (59 kg)   Last Height:   Ht Readings from Last 1 Encounters:  09/13/18 5' 2.5" (1.588 m)    Physical exam: Exam: Gen: NAD, conversant, well nourised, well groomed                     CV: RRR, no MRG. No  Carotid Bruits. No peripheral edema, warm, nontender Eyes: Conjunctivae clear without exudates or hemorrhage  Neuro: Detailed Neurologic Exam  Speech:    Speech is normal; fluent and spontaneous with normal comprehension.  Cognition:    The patient is oriented to person, place, and time;     recent and remote memory intact;     language  fluent;     normal attention, concentration,     fund of knowledge Cranial Nerves:    The pupils are equal, round, and reactive to light. The fundi are normal and spontaneous venous pulsations are present. Visual fields are full to finger confrontation. Extraocular movements are intact. Trigeminal sensation is intact and the muscles of mastication are normal. The face is symmetric. The palate elevates in the midline. Voice is normal. Shoulder shrug is normal. The tongue has normal motion without fasciculations.   Coordination:    Normal finger to nose and heel to shin. Normal rapid alternating movements.   Gait:    Heel-toe and tandem gait are normal.   Motor Observation:    No asymmetry, no atrophy, and no involuntary movements noted. Tone:    Normal muscle tone.    Posture:    Posture is normal. normal erect    Strength:    Strength is V/V in the upper and lower limbs.      Sensation: intact     Reflex Exam:  DTR's:    Deep tendon reflexes in the upper and lower extremities are normal bilaterally.   Toes:    The toes are downgoing bilaterally.   Clonus:    Clonus is absent.      Assessment/Plan:  52 year old female with PMHx chronic migraines  Failed multiple medications Did great on botox but insurance no longer covers Will try aimovig  Meds ordered this encounter  Medications  . Erenumab-aooe (AIMOVIG) 140 MG/ML SOAJ    Sig: Inject 140 mg into the skin every 30 (thirty) days.    Dispense:  1 pen    Refill:  11    Patient has copay card that lets her have the medication regardless of coverage.  Marland Kitchen. DISCONTD: SUMAtriptan  Succinate (ZEMBRACE SYMTOUCH) 3 MG/0.5ML SOAJ    Sig: Inject 3 mg into the skin once as needed for up to 1 dose. May inject again in 15 minutes. In 2 hours may repeat. up to 4x daily    Dispense:  6 pen    Refill:  6    Patient has copay card that lets her have the medication regardless of coverage.  . SUMAtriptan (TOSYMRA) 10 MG/ACT SOLN    Sig: Place 10 mg into the nose every hour as needed. Max 30mg Derl Barrow/24hr    Dispense:  2 each    Refill:  0  . SUMAtriptan Succinate (ZEMBRACE SYMTOUCH) 3 MG/0.5ML SOAJ    Sig: Inject 3 mg into the skin once as needed for up to 1 dose. May inject again in 15 minutes. In 2 hours may repeat. up to 4x daily    Dispense:  6 pen    Refill:  6    Patient has copay card that lets her have the medication regardless of coverage.     To prevent or relieve headaches, try the following: Cool Compress. Lie down and place a cool compress on your head.  Avoid headache triggers. If certain foods or odors seem to have triggered your migraines in the past, avoid them. A headache diary might help you identify triggers.  Include physical activity in your daily routine. Try a daily walk or other moderate aerobic exercise.  Manage stress. Find healthy ways to cope with the stressors, such as delegating tasks on your to-do list.  Practice relaxation techniques. Try deep breathing, yoga, massage and visualization.  Eat regularly. Eating regularly scheduled meals and maintaining a healthy diet might help prevent headaches. Also,  drink plenty of fluids.  Follow a regular sleep schedule. Sleep deprivation might contribute to headaches Consider biofeedback. With this mind-body technique, you learn to control certain bodily functions - such as muscle tension, heart rate and blood pressure - to prevent headaches or reduce headache pain.    Proceed to emergency room if you experience new or worsening symptoms or symptoms do not resolve, if you have new neurologic symptoms or if headache  is severe, or for any concerning symptom.   Provided education and documentation from American headache Society toolbox including articles on: chronic migraine medication overuse headache, chronic migraines, prevention of migraines, behavioral and other nonpharmacologic treatments for headache.  Naomie Dean, MD  [facility] [address] [place], [place] [address]  Phone [phone] Fax [phone]   A total of 25 minutes was spent face-to-face with this patient. Over half this time was spent on counseling patient on the  1. Chronic migraine without aura without status migrainosus, not intractable     diagnosis and different diagnostic and therapeutic options, counseling and coordination of care, risks ans benefits of management, compliance, or risk factor reduction and education.

## 2018-09-15 ENCOUNTER — Telehealth: Payer: Self-pay | Admitting: Neurology

## 2018-09-15 NOTE — Telephone Encounter (Signed)
noted 

## 2018-09-15 NOTE — Telephone Encounter (Signed)
Dr. Lucia GaskinsAhern brought the patient into office on 11/13 to try and initiate the start of Botox again for the patient with her new insurance.   I called the patient's new insurance to check coverage of Botox injections. I spoke with Zachary GeorgeMichelle M. Who stated that J codes administered in office are not a covered benefit. Ref#[id number]. DW   I called the patient to make her aware of this but she did not answer so I left her a VM asking her to call back.

## 2018-11-14 ENCOUNTER — Telehealth: Payer: Self-pay

## 2018-11-14 NOTE — Telephone Encounter (Signed)
Pending approval for Aimovig Rx number: [id number]  [employer] is reviewing your PA request. Typically an electronic response will be received within 72 hours

## 2018-11-16 NOTE — Telephone Encounter (Signed)
Cover my meds had clinical questions that still needed to be answered. Answered questions and faxed office notes to cover my meds. Will hear response in 72 hrs

## 2018-11-20 NOTE — Telephone Encounter (Signed)
Received paperwork from [facility] stating that Aimovig was on patient's list of covered drugs. Prior authorization not needed. Form was faxed to patient's pharmacy.   [id number] [place], [place] - [address] [phone] (Phone) [phone] (Fax)   Confirmation fax has been received.

## 2018-11-21 ENCOUNTER — Telehealth: Payer: Self-pay | Admitting: Neurology

## 2018-11-21 NOTE — Telephone Encounter (Signed)
Patient called said that her Tracy Holder was denied. She will be providing information to [place] thanks

## 2018-11-22 NOTE — Telephone Encounter (Addendum)
Patient has Aimovig on her MAR. [facility]. Patient picked up her Aimovig with $0 copay on 11/09/20. We have previous documentation that Aimovig is on her covered list of drugs. I called the patient to discuss. She stated that she was able to fill the Aimovig however she received a letter in the mail saying the medication is not covered. Patient provided her insurance information. She is aware that the copay card will still work even if it's denied. Pt appreciative.   Sandie Ano [facility] ID: [id number] BIN: [id number] PCN: [id number] GROUP: [facility] Pharmacy help desk [phone]

## 2018-11-22 NOTE — Telephone Encounter (Signed)
I called [facility] [facility] and spoke with Cordelia Pen. Another PA will be needed. She stated that patient did not meet criteria and they did not receive chart notes. I completed another PA on Cover My Meds (KEY: [id number]) and faxed chart notes to [phone] (provided by Cordelia Pen). Received a receipt of confirmation.   She has tried and failed Topamax, Lexapro, cyclobenzaprine, fluoxetine. Wellbutrin. Neurontin. Zoloft. Propranolol.

## 2018-11-23 NOTE — Telephone Encounter (Signed)
[facility] and LVM with approval of medication and end date. Left office number for call back if any questions.

## 2018-11-23 NOTE — Telephone Encounter (Signed)
Left vm for patient that her Aimovig was approve till 02/21/2019. I left message that pt needs to call pharmacy to find out when it can be refill. [facility] number was left if she had any questions.

## 2018-11-23 NOTE — Telephone Encounter (Signed)
Noted thank you

## 2018-11-23 NOTE — Telephone Encounter (Signed)
Aimovig has been approved through 02/21/2019 by [employer]. PA# [id number].

## 2018-11-24 ENCOUNTER — Other Ambulatory Visit: Payer: Self-pay | Admitting: Neurology

## 2018-12-05 ENCOUNTER — Telehealth: Payer: Self-pay | Admitting: *Deleted

## 2018-12-05 NOTE — Telephone Encounter (Signed)
Completed PA on Cover My Meds for Zembrace SYMtouch 3 mg injection. KEY: [id number]. Awaiting determination from [facility].

## 2018-12-07 NOTE — Telephone Encounter (Signed)
Zembrace Symtouch 3 mg injector denied by [employer]. Reference # [id number]. Called pt & LVM (ok per DPR) informing pt of denial however there is a platinum pass savings card which allows pt to get this for free for up to a year from activation. I asked for a call back if she has any questions. Advised that if she doesn't already have a card she can come by the office to pick one up. Left office number and hours in message including early closure today @ 2:30 d/t inclement weather.

## 2019-01-23 ENCOUNTER — Telehealth: Payer: Self-pay | Admitting: *Deleted

## 2019-01-23 NOTE — Telephone Encounter (Signed)
Called, LVM for pt to call office back. We received another PA request for Aimovig. Wanted update on her migraines to see if number/severity has decreased since starting on it.   PA Aimovig started on covermymeds. Key: [id number]. In process of completing.

## 2019-01-23 NOTE — Telephone Encounter (Signed)
I have printed OV note and telephone note and faxed to covermymeds.

## 2019-01-23 NOTE — Telephone Encounter (Signed)
Joni Reining- can you please print office note and this phone note and fax to covermymeds [phone] asking it be attached to key: [id number].  Thank you!

## 2019-01-23 NOTE — Telephone Encounter (Signed)
Patient called in and stated the Aimovig is working great she has only had 1 migraine that she can actually say was close to a migraine. She states if you need to call again just leave a message

## 2019-01-24 NOTE — Telephone Encounter (Signed)
PA submitted. Waiting on determination  "Your information has been submitted to [facility] and is being reviewed. You may close this dialog, return to your dashboard, and perform other tasks. You will receive an electronic determination in CoverMyMeds within 72-120 hours; you'll also receive a faxed copy. You can see the latest determination by locating this request on your dashboard or reopening this request. If Rosann Auerbach has not responded in 120 hours, contact [employer] at [phone]."

## 2019-01-24 NOTE — Telephone Encounter (Signed)
Request Reference Number: [id number]. AIMOVIG INJ 140MG /ML is approved through 01/23/2020. For further questions, call [phone].

## 2019-05-01 ENCOUNTER — Other Ambulatory Visit: Payer: Self-pay | Admitting: Neurology

## 2019-09-24 ENCOUNTER — Telehealth: Payer: Self-pay | Admitting: Neurology

## 2019-09-24 DIAGNOSIS — G43709 Chronic migraine without aura, not intractable, without status migrainosus: Secondary | ICD-10-CM

## 2019-09-24 MED ORDER — AIMOVIG 140 MG/ML ~~LOC~~ SOAJ: 140.0000 mg | SUBCUTANEOUS | 1 refills | Status: DC

## 2019-09-24 NOTE — Telephone Encounter (Signed)
1) Medication(s) Requested (by name): Erenumab-aooe (Loa) [place] [[id number]  2) [id number] [place], [place] 3) Special Requests:

## 2019-09-24 NOTE — Telephone Encounter (Signed)
[facility] I called the pt and LVM (ok per DPR) advising appt needed for further refills. Asked pt to call and schedule with nurse practitioner, Amy or Jinny Blossom. Can do VV if needed. Also advised if pt was not planning to return and is doing well, she could see if PCP will refill. Left office number in message.   When pt calls back, please schedule her a f/u with Amy or Megan.

## 2019-12-04 ENCOUNTER — Other Ambulatory Visit: Payer: Self-pay | Admitting: Neurology

## 2019-12-04 DIAGNOSIS — G43709 Chronic migraine without aura, not intractable, without status migrainosus: Secondary | ICD-10-CM

## 2019-12-13 ENCOUNTER — Other Ambulatory Visit: Payer: Self-pay | Admitting: Neurology

## 2019-12-23 ENCOUNTER — Other Ambulatory Visit: Payer: Self-pay | Admitting: Neurology

## 2019-12-26 ENCOUNTER — Telehealth: Payer: Self-pay

## 2019-12-26 ENCOUNTER — Other Ambulatory Visit: Payer: Self-pay

## 2019-12-26 ENCOUNTER — Encounter: Payer: Self-pay | Admitting: Neurology

## 2019-12-26 ENCOUNTER — Ambulatory Visit: Payer: No Typology Code available for payment source | Admitting: Neurology

## 2019-12-26 DIAGNOSIS — G43709 Chronic migraine without aura, not intractable, without status migrainosus: Secondary | ICD-10-CM

## 2019-12-26 MED ORDER — BOTOX 100 UNITS IJ SOLR: INTRAMUSCULAR | 0 refills | Status: DC

## 2019-12-26 NOTE — Telephone Encounter (Signed)
Bethany could you send script for Botox to [facility]?

## 2019-12-26 NOTE — Telephone Encounter (Signed)
Noted, thank you. DWD  

## 2019-12-26 NOTE — Progress Notes (Signed)
[facility]    Provider:  Dr Lucia Gaskins Referring Provider: Farris Has, MD Primary Care Physician:  Farris Has, MD  CC:  Migraines  Interval history 12/26/2019: The mask gives her a headache, she has been on Aimovig and over the last year she has 15 headache days a month and 10 migraine sdays a month. We will stop the Aimovig. No aura. No medicaton overuse. Botox in the past worked extremely well.   Interval history 09/13/2018: She takes imitrex po, injections worked great a year ago. She has 20 headaches days a month and 10 of those are migrainous > 51months. No aura. No medication overuse. Tried Botox n the past and it worked but Health visitor it. She has tried and failed multiple medications. Will try Aimovig. Botox would be much preferable she did excellent on botox.   She has tried and failed Topamax, Lexapro, cyclobenzaprine, fluoxetine. Wellbutrin. Neurontin. Zoloft.  Propranolol.  At least 5 days a week for the 6 months, 20 migraine days a month, they last 18-24 hours a day sometimes longer. They are debilitating 10/10 pain. Imitrex helps but doesn't totally take away the pain. Getting worse. Left side, throbbing, whole head with pressure, +light sensitivity, +sound sensitivity. Movement makes it worse. Has to go into a dark room. +nausea. She does not take OTC medications more than 2 days per week or 2x a day.  HPI:  Tracy Holder is a 54 y.o. female here as a referral from Dr. Kateri Plummer for Trigeminal Neuralgia  Left facial numbness and tingling been going on for 6 months. Just started one day, no inciting factors. Went to the ENT because she thought it was sinus related. ENT ruled out any sinus infections. She has numbness on the left side of the face around the eye and cheek to the left ear, sometimes tingly. Continuous, waxes and wanes as far as the severity. Unknown triggers or what makes it better or worse. Her left eye really bothers her like pressure.  No shooting pains, no electric, shock-like or stabbing pains. She was scoped by ENT and she was given an antibiotic which significantly helped but 2 weeks afterwards the symptoms came back. The symptoms can become 8-9/10 severe pain. She gets a heaviness in left face, puffiness. She has some neck pain. No other focal or otherwise neurologic symptoms. Allergy meds didn't help.   She has a history of migraines. They have been going on for years. The past year they are worse. At least 5 days a week for the last year, they last 6-12 hours a day sometimes longer. They are debilitating 10/10 pain. Imitrex helps but doesn't totally take away the pain. Getting worse. Left side, throbbing, whole head with pressure, +light sensitivity, +sound sensitivity. Has to go into a dark room. +nausea. She does not take OTC medications more than 2 days per week or 2x a day. She is on Lexapro.  She has tried and failed Topamax, cyclobenzaprine, fluoxetine.   Shingles 10 years ago in the same left face area. But these symptoms didn't start until 6 months ago.  Reviewed notes, labs and imaging from outside physicians, which showed: Personally reviewed MRi of the brain, no large ischemic strokes, no tumors or masses, normal architecture, nothing in the pons area or cp angle compressing CN5. ENT notes stae that endoscopy did not show etiology for symptoms. Notes state symptoms for 1.5 years.   Review of Systems: Patient complains of symptoms per HPI as well as the  following symptoms: headaches. Pertinent negatives per HPI. All others negative.   Social History   Socioeconomic History  . Marital status: Married    Spouse name: Augusto Gamble   . Number of children: 2  . Years of education: MASTERS  . Highest education level: Not on file  Occupational History  . Not on file  Tobacco Use  . Smoking status: Never Smoker  . Smokeless tobacco: Never Used  Substance and Sexual Activity  . Alcohol use: Yes    Comment: 1-2 WEEK  .  Drug use: No  . Sexual activity: Not on file  Other Topics Concern  . Not on file  Social History Narrative   Patient lives at home with husband   Patient is right handed.    Right handed   Caffeine: 1 cup/day   Social Determinants of Health   Financial Resource Strain:   . Difficulty of Paying Living Expenses: Not on file  Food Insecurity:   . Worried About Programme researcher, broadcasting/film/video in the Last Year: Not on file  . Ran Out of Food in the Last Year: Not on file  Transportation Needs:   . Lack of Transportation (Medical): Not on file  . Lack of Transportation (Non-Medical): Not on file  Physical Activity:   . Days of Exercise per Week: Not on file  . Minutes of Exercise per Session: Not on file  Stress:   . Feeling of Stress : Not on file  Social Connections:   . Frequency of Communication with Friends and Family: Not on file  . Frequency of Social Gatherings with Friends and Family: Not on file  . Attends Religious Services: Not on file  . Active Member of Clubs or Organizations: Not on file  . Attends Banker Meetings: Not on file  . Marital Status: Not on file  Intimate Partner Violence:   . Fear of Current or Ex-Partner: Not on file  . Emotionally Abused: Not on file  . Physically Abused: Not on file  . Sexually Abused: Not on file    Family History  Problem Relation Age of Onset  . Migraines Neg Hx     Past Medical History:  Diagnosis Date  . Migraine     Past Surgical History:  Procedure Laterality Date  . BACK SURGERY     HARR[place] ROD  . CESAREAN SECTION     x2    Current Outpatient Medications  Medication Sig Dispense Refill  . BOTOX 100 units SOLR injection INJECT 155 UNITS INTRAMUSCULARLY TO FACIAL AND NECK BY PHYSICIAN EVERY 3 MONTHS 2 vial 3  . buPROPion (WELLBUTRIN XL) 150 MG 24 hr tablet Take 150 mg by mouth daily.   0  . [facility] ALLERGY RELIEF-D 5-120 MG tablet TAKE 1 TABLET BY MOUTH EVERY DAY 30 tablet 5  . SUMAtriptan (IMITREX) 100 MG  tablet TAKE 1 TAB AT START OF HEADACHE. MAY REPEAT IN 2 HOURS. DONT TAKE MORE THEN 2 A DAY OR 2 DAYS WEEK. APPOINTMENT NEEDED FOR FURTHER REFILLS. 10 tablet 0  . SUMAtriptan (TOSYMRA) 10 MG/ACT SOLN Place 10 mg into the nose every hour as needed. Max 30mg /24hr 2 each 0  . SUMAtriptan Succinate (ZEMBRACE SYMTOUCH) 3 MG/0.5ML SOAJ Inject 3 mg into the skin once as needed for up to 1 dose. May inject again in 15 minutes. In 2 hours may repeat. up to 4x daily 6 pen 6   No current facility-administered medications for this visit.    Allergies as of 12/26/2019  . (  No Known Allergies)    Vitals: BP 112/82 (BP Location: Right Arm, Patient Position: Sitting)   Pulse 80   Temp 98.4 F (36.9 C) (Oral)   Ht 5\' 2"  (1.575 m)   Wt 132 lb (59.9 kg)   BMI 24.14 kg/m  Last Weight:  Wt Readings from Last 1 Encounters:  12/26/19 132 lb (59.9 kg)   Last Height:   Ht Readings from Last 1 Encounters:  12/26/19 5\' 2"  (1.575 m)    Physical exam: Exam: Gen: NAD, conversant, well nourised, well groomed                     CV: RRR, no MRG. No Carotid Bruits. No peripheral edema, warm, nontender Eyes: Conjunctivae clear without exudates or hemorrhage  Neuro: Detailed Neurologic Exam  Speech:    Speech is normal; fluent and spontaneous with normal comprehension.  Cognition:    The patient is oriented to person, place, and time;     recent and remote memory intact;     language fluent;     normal attention, concentration,     fund of knowledge Cranial Nerves:    The pupils are equal, round, and reactive to light. The fundi are normal and spontaneous venous pulsations are present. Visual fields are full to finger confrontation. Extraocular movements are intact. Trigeminal sensation is intact and the muscles of mastication are normal. The face is symmetric. The palate elevates in the midline. Voice is normal. Shoulder shrug is normal. The tongue has normal motion without fasciculations.    Coordination:    Normal finger to nose and heel to shin. Normal rapid alternating movements.   Gait:    Heel-toe and tandem gait are normal.   Motor Observation:    No asymmetry, no atrophy, and no involuntary movements noted. Tone:    Normal muscle tone.    Posture:    Posture is normal. normal erect    Strength:    Strength is V/V in the upper and lower limbs.      Sensation: intact     Reflex Exam:  DTR's:    Deep tendon reflexes in the upper and lower extremities are normal bilaterally.   Toes:    The toes are downgoing bilaterally.   Clonus:    Clonus is absent.      Assessment/Plan:  54 year old female with PMHx chronic migraines  Failed multiple medications and aimovig Did great on botox, please reorder  No orders of the defined types were placed in this encounter.    To prevent or relieve headaches, try the following: Cool Compress. Lie down and place a cool compress on your head.  Avoid headache triggers. If certain foods or odors seem to have triggered your migraines in the past, avoid them. A headache diary might help you identify triggers.  Include physical activity in your daily routine. Try a daily walk or other moderate aerobic exercise.  Manage stress. Find healthy ways to cope with the stressors, such as delegating tasks on your to-do list.  Practice relaxation techniques. Try deep breathing, yoga, massage and visualization.  Eat regularly. Eating regularly scheduled meals and maintaining a healthy diet might help prevent headaches. Also, drink plenty of fluids.  Follow a regular sleep schedule. Sleep deprivation might contribute to headaches Consider biofeedback. With this mind-body technique, you learn to control certain bodily functions -- such as muscle tension, heart rate and blood pressure -- to prevent headaches or reduce headache pain.  Proceed to emergency room if you experience new or worsening symptoms or symptoms do not resolve, if  you have new neurologic symptoms or if headache is severe, or for any concerning symptom.   Provided education and documentation from American headache Society toolbox including articles on: chronic migraine medication overuse headache, chronic migraines, prevention of migraines, behavioral and other nonpharmacologic treatments for headache.  Sarina Ill, MD  [facility] [address] [place], [place] [address]  Phone [phone] Fax [phone]   A total of 25 minutes was spent face-to-face with this patient. Over half this time was spent on counseling patient on the  1. Chronic migraine without aura without status migrainosus, not intractable     diagnosis and different diagnostic and therapeutic options, counseling and coordination of care, risks ans benefits of management, compliance, or risk factor reduction and education.

## 2019-12-27 MED ORDER — BOTOX 100 UNITS IJ SOLR: INTRAMUSCULAR | 0 refills | Status: DC

## 2019-12-27 NOTE — Addendum Note (Signed)
Addended by: Bertram Savin on: 12/27/2019 07:48 AM   Modules accepted: Orders

## 2020-01-01 NOTE — Telephone Encounter (Signed)
Won't the botox copay plan reimburse her up to 1500? She woul d have to pay it up front but she would get it back and it would be essentially 0 dollars for her right? If so, can you let her know?

## 2020-01-01 NOTE — Telephone Encounter (Signed)
Patient called and stated that the pharmacy was having trouble covering her medication, they told her the medication was not covered through her benefits and she would be responsible for over $1000 dollars for the Botox medication. The patient cannot afford this copay right now and is concerned about what she should do.     I called the pharmacy and the representative stated that the patient has a copay of $1,119 due to not meeting the deductible on her plan.   Please advise.

## 2020-01-02 NOTE — Telephone Encounter (Signed)
I called the patient to discuss this with her but she did not answer, and her VM was full. DWD

## 2020-01-03 ENCOUNTER — Other Ambulatory Visit: Payer: Self-pay

## 2020-01-03 ENCOUNTER — Ambulatory Visit (INDEPENDENT_AMBULATORY_CARE_PROVIDER_SITE_OTHER): Payer: No Typology Code available for payment source | Admitting: Neurology

## 2020-01-03 VITALS — Temp 97.8°F

## 2020-01-03 DIAGNOSIS — G43709 Chronic migraine without aura, not intractable, without status migrainosus: Secondary | ICD-10-CM | POA: Diagnosis not present

## 2020-01-03 NOTE — Progress Notes (Signed)
Botox- 200 units x 1 vial Lot: [id number] Expiration: 05/2022 NDC: 1791-5056-97  Bacteriostatic 0.9% Sodium Chloride- 30mL total Lot: [id number] Expiration: 01/31/2020 NDC: 5537-4827-07  Dx: G43.709 samples

## 2020-01-04 NOTE — Progress Notes (Signed)
Consent Form Botulism Toxin Injection For Chronic Migraine    Reviewed orally with patient, additionally signature is on file:  Botulism toxin has been approved by the Federal drug administration for treatment of chronic migraine. Botulism toxin does not cure chronic migraine and it may not be effective in some patients.  The administration of botulism toxin is accomplished by injecting a small amount of toxin into the muscles of the neck and head. Dosage must be titrated for each individual. Any benefits resulting from botulism toxin tend to wear off after 3 months with a repeat injection required if benefit is to be maintained. Injections are usually done every 3-4 months with maximum effect peak achieved by about 2 or 3 weeks. Botulism toxin is expensive and you should be sure of what costs you will incur resulting from the injection.  The side effects of botulism toxin use for chronic migraine may include:   -Transient, and usually mild, facial weakness with facial injections  -Transient, and usually mild, head or neck weakness with head/neck injections  -Reduction or loss of forehead facial animation due to forehead muscle weakness  -Eyelid drooping  -Dry eye  -Pain at the site of injection or bruising at the site of injection  -Double vision  -Potential unknown long term risks  Contraindications: You should not have Botox if you are pregnant, nursing, allergic to albumin, have an infection, skin condition, or muscle weakness at the site of the injection, or have myasthenia gravis, Lambert-Eaton syndrome, or ALS.  It is also possible that as with any injection, there may be an allergic reaction or no effect from the medication. Reduced effectiveness after repeated injections is sometimes seen and rarely infection at the injection site may occur. All care will be taken to prevent these side effects. If therapy is given over a long time, atrophy and wasting in the muscle injected may  occur. Occasionally the patient's become refractory to treatment because they develop antibodies to the toxin. In this event, therapy needs to be modified.  I have read the above information and consent to the administration of botulism toxin.    BOTOX PROCEDURE NOTE FOR MIGRAINE HEADACHE    Contraindications and precautions discussed with patient(above). Aseptic procedure was observed and patient tolerated procedure. Procedure performed by Dr. Toni Kosisochukwu Goldberg  The condition has existed for more than 6 months, and pt does not have a diagnosis of ALS, Myasthenia Gravis or Lambert-Eaton Syndrome.  Risks and benefits of injections discussed and pt agrees to proceed with the procedure.  Written consent obtained  These injections are medically necessary. Pt  receives good benefits from these injections. These injections do not cause sedations or hallucinations which the oral therapies may cause.  Description of procedure:  The patient was placed in a sitting position. The standard protocol was used for Botox as follows, with 5 units of Botox injected at each site:   -Procerus muscle, midline injection  -Corrugator muscle, bilateral injection  -Frontalis muscle, bilateral injection, with 2 sites each side, medial injection was performed in the upper one third of the frontalis muscle, in the region vertical from the medial inferior edge of the superior orbital rim. The lateral injection was again in the upper one third of the forehead vertically above the lateral limbus of the cornea, 1.5 cm lateral to the medial injection site.  -Temporalis muscle injection, 4 sites, bilaterally. The first injection was 3 cm above the tragus of the ear, second injection site was 1.5 cm to 3   cm up from the first injection site in line with the tragus of the ear. The third injection site was 1.5-3 cm forward between the first 2 injection sites. The fourth injection site was 1.5 cm posterior to the second injection  site.   -Occipitalis muscle injection, 3 sites, bilaterally. The first injection was done one half way between the occipital protuberance and the tip of the mastoid process behind the ear. The second injection site was done lateral and superior to the first, 1 fingerbreadth from the first injection. The third injection site was 1 fingerbreadth superiorly and medially from the first injection site.  -Cervical paraspinal muscle injection, 2 sites, bilateral knee first injection site was 1 cm from the midline of the cervical spine, 3 cm inferior to the lower border of the occipital protuberance. The second injection site was 1.5 cm superiorly and laterally to the first injection site.  -Trapezius muscle injection was performed at 3 sites, bilaterally. The first injection site was in the upper trapezius muscle halfway between the inflection point of the neck, and the acromion. The second injection site was one half way between the acromion and the first injection site. The third injection was done between the first injection site and the inflection point of the neck.   Will return for repeat injection in 3 months.   200 units of Botox was used, any Botox not injected was wasted. The patient tolerated the procedure well, there were no complications of the above procedure.   I spent 45  minutes of face-to-face and non-face-to-face  time with patient on the procedures as well as  1. Chronic migraine without aura without status migrainosus, not intractable    diagnosis.  This included previsit chart review, lab review, study review, order entry, electronic health record documentation, preparing botox, patient education on the different diagnostic and therapeutic options, counseling and coordination of care, risks and benefits of management, compliance, or risk factor reduction

## 2020-01-20 ENCOUNTER — Other Ambulatory Visit: Payer: Self-pay | Admitting: Neurology

## 2020-01-24 ENCOUNTER — Other Ambulatory Visit: Payer: Self-pay | Admitting: Neurology

## 2020-01-25 ENCOUNTER — Telehealth: Payer: Self-pay | Admitting: Neurology

## 2020-01-25 NOTE — Telephone Encounter (Signed)
Pt has called for a refill on her SUMAtriptan (IMITREX) 100 MG tablet  [phone]

## 2020-01-28 MED ORDER — SUMATRIPTAN SUCCINATE 100 MG PO TABS: ORAL_TABLET | ORAL | 5 refills | Status: DC

## 2020-01-28 NOTE — Addendum Note (Signed)
Addended by: Bertram Savin on: 01/28/2020 09:59 AM   Modules accepted: Orders

## 2020-01-28 NOTE — Telephone Encounter (Signed)
Last seen 01/03/20. Refills sent.

## 2020-03-13 ENCOUNTER — Telehealth: Payer: Self-pay | Admitting: *Deleted

## 2020-03-13 NOTE — Telephone Encounter (Signed)
Patient has a Botox appointment on 04/10/2020.  I called [employer] [phone] and spoke to [facility].  She states that [id number] and [id number] are valid and billable and do not require PA.  Ref# for call is [phone].  I valled Lucile Crater [phone] and spoke to Angus Palms to check status of RX and schedule delivery. They will call patient to go over benefits and get consent.  Once that is taken care of they will call us to schedule delivery.

## 2020-03-19 NOTE — Telephone Encounter (Signed)
I called [facility] [phone] and spoke to Homer C Shalen Petrak.  He states they are still waiting on patient consent.  He placed me of hold and called patient to try and obtain. He got no answer but left a voicemail requesting a call back.  I called patient and as well and  left a message requesting she call them so we can be assured the her medication will arrive in time for her appointment.  Pending consent delivery is set for 03/25/2020.

## 2020-03-24 NOTE — Telephone Encounter (Signed)
I called [facility] and spoke with Rowi P. to check status of Botox delivery. He states they attempted to contact the patient on 5/21 to gain consent and were unable to reach her. I attempted to do a conference call with [facility] & patient, but she did not answer. We left a voicemail asking her to call the pharmacy so that they could obtain consent and ship Botox.

## 2020-03-28 NOTE — Telephone Encounter (Signed)
I called [facility] [phone]) again today to check if I could schedule delivery of patient's medication. I spoke with Dahlia Client. She states that they still have not been able to reach patient. I attempted to call patient while on the phone with [facility] and was not able to reach her. I left her a message and stated how important it is that she call [facility] and give consent for shipment because her appointment is coming up on 6/10. Provided number to [facility] as well as our office number.

## 2020-04-03 NOTE — Telephone Encounter (Signed)
I called [facility] to check on the status of patient's order. I spoke to [place] who states that the patient has not given them consent. Patient has an appointment on 6/10. Tracy Holder states that because she has not given consent, her order will be cancelled soon. FYI

## 2020-04-07 NOTE — Telephone Encounter (Signed)
Pt called to cancel her appointment due to her being out of town at that time.  Pt will call back to r/s.

## 2020-04-07 NOTE — Telephone Encounter (Signed)
Noted, thank you

## 2020-04-08 ENCOUNTER — Ambulatory Visit: Payer: No Typology Code available for payment source | Admitting: Neurology

## 2020-04-10 ENCOUNTER — Ambulatory Visit: Payer: Self-pay | Admitting: Neurology

## 2020-04-28 ENCOUNTER — Other Ambulatory Visit: Payer: Self-pay | Admitting: Neurology

## 2020-04-28 ENCOUNTER — Telehealth: Payer: Self-pay | Admitting: Neurology

## 2020-04-28 DIAGNOSIS — G43709 Chronic migraine without aura, not intractable, without status migrainosus: Secondary | ICD-10-CM

## 2020-04-28 MED ORDER — AIMOVIG 140 MG/ML ~~LOC~~ SOAJ: 140.0000 mg | SUBCUTANEOUS | 11 refills | Status: DC

## 2020-04-28 NOTE — Telephone Encounter (Signed)
Patient is calling she would like a refill Aimovig

## 2020-04-28 NOTE — Telephone Encounter (Signed)
Done. thanks

## 2020-08-12 ENCOUNTER — Telehealth: Payer: Self-pay | Admitting: Neurology

## 2020-08-12 NOTE — Telephone Encounter (Signed)
I called the pt this morning and spoke with her. She stated the pharmacy advised they were waiting on a refill authorization from our office. [facility]. He advised they are actually waiting on a PA from our office.

## 2020-08-12 NOTE — Telephone Encounter (Signed)
Dr Lucia Gaskins sent 11 refills of [facility] to [id number] in June 2021.

## 2020-08-12 NOTE — Telephone Encounter (Signed)
Completed Aimovig PA on Cover My Meds. Key: [id number]. Awaiting determination from [facility].

## 2020-08-12 NOTE — Telephone Encounter (Signed)
Pt request refill Erenumab-aooe (AIMOVIG) 140 MG/ML [phone]

## 2020-08-12 NOTE — Telephone Encounter (Signed)
Per [facility], Aimovig denied. "The request for coverage for Aimovig 140mg /ml, use as directed (1 per month), is denied. This decision is based on health plan criteria for Aimovig. Reauthorization of this medicine is covered only if: (1) You have experienced a positive response to therapy, demonstrated by a reduction in headache frequency and/or intensity. (2) The medication will not be used in combination with another calcitonin gene-related peptide antagonist or inhibitor used for the preventive treatment of migraines (for example, Nurtec ODT, Vyepti). The information provided does not show that you meet the criteria listed above."   [address] [address] [place], [place] Fax: [phone]  Patient should be able to use the savings card for the Aimovig. [facility] and [facility] were able to get the savings card to process for $0 copay for pt. They will refill and text patient when it's ready to be picked up.

## 2020-08-15 ENCOUNTER — Other Ambulatory Visit: Payer: Self-pay

## 2020-08-15 ENCOUNTER — Other Ambulatory Visit: Payer: Self-pay | Admitting: Family Medicine

## 2020-08-15 ENCOUNTER — Ambulatory Visit
Admission: RE | Admit: 2020-08-15 | Discharge: 2020-08-15 | Disposition: A | Discharge status: Home / Self Care | Payer: No Typology Code available for payment source | Source: Ambulatory Visit | Attending: Family Medicine | Admitting: Family Medicine

## 2020-08-15 DIAGNOSIS — M898X1 Other specified disorders of bone, shoulder: Secondary | ICD-10-CM

## 2020-08-25 ENCOUNTER — Telehealth: Payer: Self-pay | Admitting: Neurology

## 2020-08-25 NOTE — Telephone Encounter (Signed)
Cover My Meds (Rachael) called, last PA created had a unfavorable outcome. Entered a new PA for Erenumab-aooe (AIMOVIG) 140 MG/ML SOAJ Nurse would need to go through and answer the questions. The reference key: [id number]

## 2020-08-25 NOTE — Telephone Encounter (Signed)
These are duplicate PAs. Pt is using savings card.

## 2020-08-31 ENCOUNTER — Other Ambulatory Visit: Payer: Self-pay | Admitting: Neurology

## 2020-09-09 ENCOUNTER — Other Ambulatory Visit: Payer: Self-pay | Admitting: Orthopedic Surgery

## 2020-09-09 DIAGNOSIS — M25512 Pain in left shoulder: Secondary | ICD-10-CM

## 2020-09-30 ENCOUNTER — Ambulatory Visit
Admission: RE | Admit: 2020-09-30 | Discharge: 2020-09-30 | Disposition: A | Discharge status: Home / Self Care | Payer: No Typology Code available for payment source | Source: Ambulatory Visit | Attending: Orthopedic Surgery | Admitting: Orthopedic Surgery

## 2020-09-30 DIAGNOSIS — M25512 Pain in left shoulder: Secondary | ICD-10-CM

## 2020-10-28 ENCOUNTER — Telehealth: Payer: Self-pay

## 2020-10-28 NOTE — Telephone Encounter (Signed)
NOTES ON FILE FROM [facility] [phone], SENT REFERRAL TO SCHEDULING 

## 2020-10-30 ENCOUNTER — Telehealth: Payer: Self-pay

## 2020-10-30 NOTE — Telephone Encounter (Signed)
Patient is calling to report that she has had a migraine for about 5 days now. She had been taking Aimovig regularly and it has been helping tremendously, however, she missed last month due to the pharmacy stating that her insurance isn't covering it. Patient has oral imitrex and has had to take one every day with no relief. Per 08/12/20 telephone note, Toma Copier had spoken with the pharmacy about using the copay card but pharmacy has not continued to use it. Please advise.

## 2020-10-30 NOTE — Telephone Encounter (Signed)
I sent patient an email since the office is closed for the holiday weekend. Offered to get her a sample on Monday of AImovig until we could figure it out or switch to Ajovy over the weekend as they still have a copay card that works.

## 2020-11-03 NOTE — Telephone Encounter (Signed)
We received a fax from [facility] stating the Aimovig is denied. If appeal is desired, fax to [phone].   Patient should be able to use the savings card. I spoke with the patient and she will download a new card for 2022.

## 2020-11-03 NOTE — Telephone Encounter (Signed)
I called the pharmacy. I was told the patient needs to re-enroll with a new card for 2022. I called the pt and let her know. She will download from the [employer]. She verbalized appreciation.

## 2020-11-06 ENCOUNTER — Telehealth: Payer: Self-pay

## 2020-11-06 NOTE — Telephone Encounter (Signed)
Ct of the chest on file

## 2020-11-13 ENCOUNTER — Encounter: Payer: Self-pay | Admitting: General Practice

## 2020-12-01 ENCOUNTER — Telehealth: Payer: Self-pay | Admitting: Neurology

## 2020-12-01 NOTE — Telephone Encounter (Signed)
Completed Aimovig PA on Cover My Meds. Key: [id number]. Awaiting determination from [facility].

## 2020-12-01 NOTE — Telephone Encounter (Signed)
Noted  

## 2020-12-01 NOTE — Telephone Encounter (Signed)
[facility] and obtained insurance information: UHC ID: [id number] BIN: [id number] PCN: [id number] GROUP: Glade [place]

## 2020-12-01 NOTE — Telephone Encounter (Signed)
Pt called, [facility], need PA from physician to insurance company before they will fill Erenumab-aooe (AIMOVIG) 140 MG/ML SOAJ. Would like a call from the nurse.

## 2020-12-01 NOTE — Telephone Encounter (Signed)
Received fax stating [facility] canceled the PA because there was already a denial on file. Reference # [id number].  [facility] and was told the copay card went through for $0 copay and its ready for patient pickup. They notify patient.

## 2020-12-09 ENCOUNTER — Other Ambulatory Visit: Payer: Self-pay | Admitting: Neurology

## 2020-12-31 ENCOUNTER — Other Ambulatory Visit: Payer: Self-pay | Admitting: Neurology

## 2021-01-06 ENCOUNTER — Telehealth: Payer: Self-pay | Admitting: Neurology

## 2021-01-06 MED ORDER — AIMOVIG 140 MG/ML ~~LOC~~ SOAJ: 140.0000 mg | SUBCUTANEOUS | 2 refills | Status: DC

## 2021-01-06 NOTE — Telephone Encounter (Signed)
Done. Also placed note in prescription for pt to call and schedule a follow-up.

## 2021-01-06 NOTE — Telephone Encounter (Signed)
Received another PA for Aimovig. This has been completed on Cover My Meds. Key: [id number]. Awaiting determination from [facility].

## 2021-01-06 NOTE — Telephone Encounter (Signed)
Information on covermymeds for [id number]:  Message from Plan: We received your request for prior authorization for AIMOVIG, for the above member; however, [facility] has a denied request on file for AIMOVIG for this member. Please follow the appeals process outlined in the original denial or contact Prior Authorization Department at [phone] for further questions.

## 2021-01-06 NOTE — Telephone Encounter (Signed)
Pt request rerfill Erenumab-aooe (AIMOVIG) 140 MG[phone]

## 2021-01-15 NOTE — Telephone Encounter (Signed)
Pt has called to report that she is being told by the pharmacy that Dr Lucia Gaskins needs to call the insurance company for the approval of her Aimovig medication.

## 2021-01-15 NOTE — Telephone Encounter (Signed)
There was one question on PA answered in error. I have opened a new key in hopes insurance will accept this one. Key: [id number].

## 2021-01-15 NOTE — Telephone Encounter (Signed)
Aimovig PA completed on Cover My Meds. Key: [id number]. Awaiting determination from [facility].

## 2021-01-19 NOTE — Telephone Encounter (Signed)
Per [facility], "[phone] case has been terminated for Aimovig Inj 140mg /Ml, use as directed (1 per month), for the following reason: The requested drug is not managed by the [facility] Prior Authorization department. Please contact <[employer] fully-insured plans at [phone] OR All Savers plan at [phone]>."

## 2021-01-19 NOTE — Telephone Encounter (Addendum)
I called Tracy Holder Rule and they answered as [employer].  We discussed the call was regarding aimovig and I was advised that a PA is not required however the patient does have a $7500 deductible and so far she has paid just a little over thousand towards it.  I am not sure if pt is eligible for savings card as I was told this is a self-funded plan.  I was also told that once patient meets her deductible the plan will pay 70% up to $15,000 then 100%.   [facility]. He still got a PA rejection but stated he would call the plan himself to inquire as to why they are getting the rejection. He will call us back if needed. I called the pt and LVM (ok per DPR) advising her of the update on the Aimovig. Left office number in message if needed.

## 2021-01-20 NOTE — Telephone Encounter (Signed)
Pt called back needing clarification on the message that was left for her. Please call back when available.

## 2021-01-21 NOTE — Telephone Encounter (Signed)
Spoke with patient.  She stated she was able to get the Aimovig today from the pharmacy with the savings card even though they got a PA rejection.  Hopefully next month will be easier.

## 2021-02-18 ENCOUNTER — Telehealth: Payer: Self-pay | Admitting: Neurology

## 2021-02-18 NOTE — Telephone Encounter (Signed)
Pt has called to inform Elana Alm that the same problem she had with her insurance re: Erenumab-aooe (AIMOVIG) 140 MG/ML SOAJ she is having again.  Please call

## 2021-02-18 NOTE — Telephone Encounter (Signed)
[facility]. They tried to process a savings card since insurance had stated the PA was not needed. They received a message that said patient needs to re-enroll into the program.  They also said they would call the insurance to see why they keep getting a PA rejection even though one is supposedly not required. I called the patient.  She stated she enrolled again on January 3 and gave the call to the pharmacy. The [facility] is [facility] to confirm they are using the newest card.  She will call me back if she continues to have trouble getting the medication.

## 2021-03-03 ENCOUNTER — Encounter: Payer: Self-pay | Admitting: *Deleted

## 2021-03-03 ENCOUNTER — Telehealth: Payer: Self-pay | Admitting: Neurology

## 2021-03-03 NOTE — Telephone Encounter (Signed)
Spoke with the patient.  She stated she called her insurance and the Aimovig savings card company and she has been told a PA is needed despite our previous conversation with insurance in March that a PA was indeed not needed.  I will call her insurance to try to complete a PA to see if this will satisfy the requirements of the patient can continue using her savings card.  She verbalized appreciation.

## 2021-03-03 NOTE — Telephone Encounter (Signed)
Pt called wanting to discuss her Erenumab-aooe (AIMOVIG) 140 MG/ML SOAJ with the RN. Pt states that she is having the same issue as every month with the PA for this medication.

## 2021-03-03 NOTE — Telephone Encounter (Signed)
Tried to call Renette Butters Rule at [phone] and this was the number for [employer].  I was told that a review of benefits was needed for the Aimovig.  The representative stated this was not under the normal prior authorization guidelines but would be  Under the medical benefits if approved.  Patient's deductible would apply.  I was told to fax a request for review with clinical notes to [employer] in [place] at [phone].   I will write a letter requesting approval for a month as I was told they do not have a PA form that they use.

## 2021-03-03 NOTE — Telephone Encounter (Signed)
The last office note states patient was going to do Botox and stop Aimovig but we have not seen her for Botox in over a year.  She has been getting the Aimovig instead.  Need updated clinical notes.  Spoke with patient and scheduled her with Amy NP this Thursday at 1:00.

## 2021-03-03 NOTE — Progress Notes (Signed)
Error

## 2021-03-05 ENCOUNTER — Other Ambulatory Visit: Payer: Self-pay

## 2021-03-05 ENCOUNTER — Ambulatory Visit (INDEPENDENT_AMBULATORY_CARE_PROVIDER_SITE_OTHER): Payer: No Typology Code available for payment source | Admitting: Family Medicine

## 2021-03-05 ENCOUNTER — Encounter: Payer: Self-pay | Admitting: Family Medicine

## 2021-03-05 VITALS — BP 134/89 | HR 72 | Ht 62.0 in | Wt 123.0 lb

## 2021-03-05 DIAGNOSIS — G43709 Chronic migraine without aura, not intractable, without status migrainosus: Secondary | ICD-10-CM

## 2021-03-05 MED ORDER — AIMOVIG 140 MG/ML ~~LOC~~ SOAJ
140.0000 mg | SUBCUTANEOUS | 3 refills | Status: DC
Start: 2021-03-05 — End: 2021-09-23

## 2021-03-05 MED ORDER — SUMATRIPTAN SUCCINATE 100 MG PO TABS
ORAL_TABLET | ORAL | 11 refills | Status: DC
Start: 2021-03-05 — End: 2022-03-11

## 2021-03-05 NOTE — Progress Notes (Addendum)
Chief Complaint  Patient presents with  . Follow-up    RM 1, alone, migraines- 1x a month since taking Aimovig     HISTORY OF PRESENT ILLNESS: 03/05/21 ALL:  Tracy Holder is a 55 y.o. female here today for follow up for migraines. She continues Amovig monthly and sumatriptan for abortive therapy. No longer on Botox therapy (was effective but insurance would not cover). She is doing very well. She feels Amovig has been a miracle drug. She has about 1 migraine per month. Sumatriptan works well for abortive therapy. She is very happy with how well migraines are managed.     HISTORY (copied from Dr Trevor MaceAhern's previous note)  Interval history 12/26/2019: The mask gives her a headache, she has been on Aimovig and over the last year she has 15 headache days a month and 10 migraine sdays a month. We will stop the Aimovig. No aura. No medicaton overuse. Botox in the past worked extremely well.   Interval history 09/13/2018: She takes imitrex po, injections worked great a year ago. She has 20 headaches days a month and 10 of those are migrainous > 6months. No aura. No medication overuse. Tried Botox n the past and it worked but Health visitorinsurance doesn't cover it. She has tried and failed multiple medications. Will try Aimovig. Botox would be much preferable she did excellent on botox.   She has tried and failed Topamax, Lexapro, cyclobenzaprine, fluoxetine. Wellbutrin. Neurontin. Zoloft.  Propranolol.  At least 5 days a week for the 6 months, 20 migraine days a month, they last 18-24 hours a day sometimes longer. They are debilitating 10/10 pain. Imitrex helps but doesn't totally take away the pain. Getting worse. Left side, throbbing, whole head with pressure, +light sensitivity, +sound sensitivity. Movement makes it worse. Has to go into a dark room. +nausea. She does not take OTC medications more than 2 days per week or 2x a day.  HPI:  Tracy Holder is a 55 y.o. female here as a referral from Dr. Kateri PlummerMorrow  for Trigeminal Neuralgia  Left facial numbness and tingling been going on for 6 months. Just started one day, no inciting factors. Went to the ENT because she thought it was sinus related. ENT ruled out any sinus infections. She has numbness on the left side of the face around the eye and cheek to the left ear, sometimes tingly. Continuous, waxes and wanes as far as the severity. Unknown triggers or what makes it better or worse. Her left eye really bothers her like pressure. No shooting pains, no electric, shock-like or stabbing pains. She was scoped by ENT and she was given an antibiotic which significantly helped but 2 weeks afterwards the symptoms came back. The symptoms can become 8-9/10 severe pain. She gets a heaviness in left face, puffiness. She has some neck pain. No other focal or otherwise neurologic symptoms. Allergy meds didn't help.   She has a history of migraines. They have been going on for years. The past year they are worse. At least 5 days a week for the last year, they last 6-12 hours a day sometimes longer. They are debilitating 10/10 pain. Imitrex helps but doesn't totally take away the pain. Getting worse. Left side, throbbing, whole head with pressure, +light sensitivity, +sound sensitivity. Has to go into a dark room. +nausea. She does not take OTC medications more than 2 days per week or 2x a day. She is on Lexapro.  She has tried and failed Topamax, cyclobenzaprine, fluoxetine.  Shingles 10 years ago in the same left face area. But these symptoms didn't start until 6 months ago.  Reviewed notes, labs and imaging from outside physicians, which showed: Personally reviewed MRi of the brain, no large ischemic strokes, no tumors or masses, normal architecture, nothing in the pons area or cp angle compressing CN5. ENT notes stae that endoscopy did not show etiology for symptoms. Notes state symptoms for 1.5 years.    REVIEW OF SYSTEMS: Out of a complete 14 system review of  symptoms, the patient complains only of the following symptoms, headaches and all other reviewed systems are negative.    ALLERGIES: No Known Allergies   HOME MEDICATIONS: Outpatient Medications Prior to Visit  Medication Sig Dispense Refill  . buPROPion (WELLBUTRIN XL) 150 MG 24 hr tablet Take 150 mg by mouth daily.   0  . [facility] ALLERGY RELIEF-D 5-120 MG tablet TAKE 1 TABLET BY MOUTH EVERY DAY 30 tablet 5  . BOTOX 100 units SOLR injection INJECT 155 UNITS INTRAMUSCULARLY TO FACIAL AND NECK BY PHYSICIAN EVERY 3 MONTHS 2 vial 3  . botulinum toxin Type A (BOTOX) 100 units SOLR injection Provider to inject 155 units into the muscles of the head and neck every 3 months. Discard remainder. 2 each 0  . Erenumab-aooe (AIMOVIG) 140 MG/ML SOAJ Inject 140 mg into the skin every 30 (thirty) days. Please schedule an appointment for follow-up. 1 mL 2  . SUMAtriptan (IMITREX) 100 MG tablet TAKE 1 TAB AT START OF HEADACHE. MAY REPEAT IN 2 HOURS. DONT TAKE MORE THEN 2 A DAY OR 2 DAYS WEEK. Appointment needed for further refills. 10 tablet 1  . SUMAtriptan (TOSYMRA) 10 MG/ACT SOLN Place 10 mg into the nose every hour as needed. Max 30mg /24hr 2 each 0  . SUMAtriptan Succinate (ZEMBRACE SYMTOUCH) 3 MG/0.5ML SOAJ Inject 3 mg into the skin once as needed for up to 1 dose. May inject again in 15 minutes. In 2 hours may repeat. up to 4x daily 6 pen 6   No facility-administered medications prior to visit.     PAST MEDICAL HISTORY: Past Medical History:  Diagnosis Date  . Migraine      PAST SURGICAL HISTORY: Past Surgical History:  Procedure Laterality Date  . BACK SURGERY     HARRINGTON ROD  . CESAREAN SECTION     x2     FAMILY HISTORY: Family History  Problem Relation Age of Onset  . Migraines Neg Hx      SOCIAL HISTORY: Social History   Socioeconomic History  . Marital status: Married    Spouse name:   . Number of children: 2  . Years of education: MASTERS  . Highest education  level: Not on file  Occupational History  . Not on file  Tobacco Use  . Smoking status: Never Smoker  . Smokeless tobacco: Never Used  Vaping Use  . Vaping Use: Never used  Substance and Sexual Activity  . Alcohol use: Yes    Comment: 1-2 WEEK  . Drug use: No  . Sexual activity: Not on file  Other Topics Concern  . Not on file  Social History Narrative   Patient lives at home with husband   Patient is right handed.    Right handed   Caffeine: 1 cup/day   Social Determinants of Health   Financial Resource Strain: Not on file  Food Insecurity: Not on file  Transportation Needs: Not on file  Physical Activity: Not on file  Stress: Not  on file  Social Connections: Not on file  Intimate Partner Violence: Not on file      PHYSICAL EXAM  Vitals:   03/05/21 1300  BP: 134/89  Pulse: 72  Weight: 123 lb (55.8 kg)  Height: 5\' 2"  (1.575 m)   Body mass index is 22.5 kg/m.   Generalized: Well developed, in no acute distress  Cardiology: normal rate and rhythm, no murmur auscultated  Respiratory: clear to auscultation bilaterally    Neurological examination  Mentation: Alert oriented to time, place, history taking. Follows all commands speech and language fluent Cranial nerve II-XII: Pupils were equal round reactive to light. Extraocular movements were full, visual field were full on confrontational test. Facial sensation and strength were normal. Head turning and shoulder shrug  were normal and symmetric. Motor: The motor testing reveals 5 over 5 strength of all 4 extremities. Good symmetric motor tone is noted throughout.  Gait and station: Gait is normal.     DIAGNOSTIC DATA (LABS, IMAGING, TESTING) - I reviewed patient records, labs, notes, testing and imaging myself where available.  Lab Results  Component Value Date   WBC 5.1 10/16/2014   HGB 12.8 10/16/2014   HCT 37.5 10/16/2014   MCV 93 10/16/2014   PLT 228 10/16/2014      Component Value Date/Time    NA 139 10/16/2014 1146   K 3.8 10/16/2014 1146   CL 102 10/16/2014 1146   CO2 24 10/16/2014 1146   GLUCOSE 60 (L) 10/16/2014 1146   BUN 12 10/16/2014 1146   CREATININE 0.78 10/16/2014 1146   CALCIUM 9.2 10/16/2014 1146   PROT 6.7 10/16/2014 1146   ALBUMIN 4.4 10/16/2014 1146   AST 22 10/16/2014 1146   ALT 12 10/16/2014 1146   ALKPHOS 59 10/16/2014 1146   BILITOT 0.3 10/16/2014 1146   GFRNONAA 91 10/16/2014 1146   GFRAA 105 10/16/2014 1146   No results found for: CHOL, HDL, LDLCALC, LDLDIRECT, TRIG, CHOLHDL No results found for: 10/18/2014 No results found for: VITAMINB12 No results found for: TSH  No flowsheet data found.   No flowsheet data found.   ASSESSMENT AND PLAN  55 y.o. year old female  has a past medical history of Migraine. here with     Chronic migraine without aura without status migrainosus, not intractable - Plan: Erenumab-aooe (AIMOVIG) 140 MG/ML SOAJ, SUMAtriptan (IMITREX) 100 MG tablet  Chevy is doing very well on Amovig and sumatriptan. We will continue current treatment plan. She will continue healthy lifestyle habits. She will follow up with PCP as directed. Follow up with me in 1 year, sooner if needed.    No orders of the defined types were placed in this encounter.    Meds ordered this encounter  Medications  . Erenumab-aooe (AIMOVIG) 140 MG/ML SOAJ    Sig: Inject 140 mg into the skin every 30 (thirty) days.    Dispense:  3 mL    Refill:  3    Order Specific Question:   Supervising Provider    Answer:   Iona Hansen Anson Fret  . SUMAtriptan (IMITREX) 100 MG tablet    Sig: TAKE 1 TAB AT START OF HEADACHE. MAY REPEAT IN 2 HOURS. DONT TAKE MORE THEN 2 A DAY OR 2 DAYS WEEK. Appointment needed for further refills.    Dispense:  10 tablet    Refill:  11    Patient needs an appt    Order Specific Question:   Supervising Provider    Answer:   [id number],  Griselda Miner [[id number]]    Shawnie Dapper, MSN, FNP-C 03/05/2021, 1:25 PM  [facility] [address] [place], [place] [address] [phone]  agree with assessment and plan as stated.     Naomie Dean, MD [facility]

## 2021-03-05 NOTE — Patient Instructions (Signed)
Below is our plan:  We will continue Amovig and sumatriptan.   Please make sure you are staying well hydrated. I recommend 50-60 ounces daily. Well balanced diet and regular exercise encouraged. Consistent sleep schedule with 6-8 hours recommended.   Please continue follow up with care team as directed.   Follow up with me in 1 year   You may receive a survey regarding today's visit. I encourage you to leave honest feed back as I do use this information to improve patient care. Thank you for seeing me today!      Migraine Headache A migraine headache is a very strong throbbing pain on one side or both sides of your head. This type of headache can also cause other symptoms. It can last from 4 hours to 3 days. Talk with your doctor about what things may bring on (trigger) this condition. What are the causes? The exact cause of this condition is not known. This condition may be triggered or caused by:  Drinking alcohol.  Smoking.  Taking medicines, such as: ? Medicine used to treat chest pain (nitroglycerin). ? Birth control pills. ? Estrogen. ? Some blood pressure medicines.  Eating or drinking certain products.  Doing physical activity. Other things that may trigger a migraine headache include:  Having a menstrual period.  Pregnancy.  Hunger.  Stress.  Not getting enough sleep or getting too much sleep.  Weather changes.  Tiredness (fatigue). What increases the risk?  Being 31-107 years old.  Being female.  Having a family history of migraine headaches.  Being Caucasian.  Having depression or anxiety.  Being very overweight. What are the signs or symptoms?  A throbbing pain. This pain may: ? Happen in any area of the head, such as on one side or both sides. ? Make it hard to do daily activities. ? Get worse with physical activity. ? Get worse around bright lights or loud noises.  Other symptoms may include: ? Feeling sick to your stomach  (nauseous). ? Vomiting. ? Dizziness. ? Being sensitive to bright lights, loud noises, or smells.  Before you get a migraine headache, you may get warning signs (an aura). An aura may include: ? Seeing flashing lights or having blind spots. ? Seeing bright spots, halos, or zigzag lines. ? Having tunnel vision or blurred vision. ? Having numbness or a tingling feeling. ? Having trouble talking. ? Having weak muscles.  Some people have symptoms after a migraine headache (postdromal phase), such as: ? Tiredness. ? Trouble thinking (concentrating). How is this treated?  Taking medicines that: ? Relieve pain. ? Relieve the feeling of being sick to your stomach. ? Prevent migraine headaches.  Treatment may also include: ? Having acupuncture. ? Avoiding foods that bring on migraine headaches. ? Learning ways to control your body functions (biofeedback). ? Therapy to help you know and deal with negative thoughts (cognitive behavioral therapy). Follow these instructions at home: Medicines  Take over-the-counter and prescription medicines only as told by your doctor.  Ask your doctor if the medicine prescribed to you: ? Requires you to avoid driving or using heavy machinery. ? Can cause trouble pooping (constipation). You may need to take these steps to prevent or treat trouble pooping:  Drink enough fluid to keep your pee (urine) pale yellow.  Take over-the-counter or prescription medicines.  Eat foods that are high in fiber. These include beans, whole grains, and fresh fruits and vegetables.  Limit foods that are high in fat and sugar. These include  fried or sweet foods. Lifestyle  Do not drink alcohol.  Do not use any products that contain nicotine or tobacco, such as cigarettes, e-cigarettes, and chewing tobacco. If you need help quitting, ask your doctor.  Get at least 8 hours of sleep every night.  Limit and deal with stress. General instructions  Keep a journal to  find out what may bring on your migraine headaches. For example, write down: ? What you eat and drink. ? How much sleep you get. ? Any change in what you eat or drink. ? Any change in your medicines.  If you have a migraine headache: ? Avoid things that make your symptoms worse, such as bright lights. ? It may help to lie down in a dark, quiet room. ? Do not drive or use heavy machinery. ? Ask your doctor what activities are safe for you.  Keep all follow-up visits as told by your doctor. This is important.      Contact a doctor if:  You get a migraine headache that is different or worse than others you have had.  You have more than 15 headache days in one month. Get help right away if:  Your migraine headache gets very bad.  Your migraine headache lasts longer than 72 hours.  You have a fever.  You have a stiff neck.  You have trouble seeing.  Your muscles feel weak or like you cannot control them.  You start to lose your balance a lot.  You start to have trouble walking.  You pass out (faint).  You have a seizure. Summary  A migraine headache is a very strong throbbing pain on one side or both sides of your head. These headaches can also cause other symptoms.  This condition may be treated with medicines and changes to your lifestyle.  Keep a journal to find out what may bring on your migraine headaches.  Contact a doctor if you get a migraine headache that is different or worse than others you have had.  Contact your doctor if you have more than 15 headache days in a month. This information is not intended to replace advice given to you by your health care provider. Make sure you discuss any questions you have with your health care provider. Document Revised: 02/09/2019 Document Reviewed: 11/30/2018 Elsevier Patient Education  2021 [employer].

## 2021-03-13 ENCOUNTER — Encounter: Payer: Self-pay | Admitting: *Deleted

## 2021-03-13 NOTE — Telephone Encounter (Signed)
Pt called wanting to know the update on the authorization for her Aimovig. Please advise.

## 2021-03-13 NOTE — Telephone Encounter (Signed)
Letter and office notes faxed to Gamaliel Rule at [phone]. Received a receipt of confirmation.

## 2021-03-19 NOTE — Telephone Encounter (Signed)
[facility] called [facility] and was told the Aimovig would not go through. I called [facility] with [employer] and spoke with [employer]. He states a letter was mailed out yesterday that stated the plan would cover Aimovig as long as the plan is in force. I told him the pharmacy could not run it and he did not know why. [facility] and they were finally able to get it go through and were able to apply the savings card so the pt's copay is $5. I called the patient and LVM (ok per DPR) letting her know its been approved and ready for pickup.

## 2021-04-29 ENCOUNTER — Ambulatory Visit: Payer: No Typology Code available for payment source | Admitting: Neurology

## 2021-07-16 ENCOUNTER — Telehealth: Payer: Self-pay | Admitting: Neurology

## 2021-07-16 ENCOUNTER — Encounter: Payer: Self-pay | Admitting: Family Medicine

## 2021-07-16 NOTE — Telephone Encounter (Signed)
PA completed on [facility] for Aimovig KEY:: [id number] Will await determination

## 2021-07-16 NOTE — Telephone Encounter (Signed)
States that medication is already approved from 03/18/2021 until 10/30/2021.

## 2021-09-22 ENCOUNTER — Encounter: Payer: Self-pay | Admitting: Family Medicine

## 2021-09-23 ENCOUNTER — Telehealth: Payer: Self-pay | Admitting: Family Medicine

## 2021-09-23 DIAGNOSIS — G43709 Chronic migraine without aura, not intractable, without status migrainosus: Secondary | ICD-10-CM

## 2021-09-23 MED ORDER — AIMOVIG 140 MG/ML ~~LOC~~ SOAJ: 140.0000 mg | SUBCUTANEOUS | 6 refills | Status: DC

## 2021-09-23 NOTE — Telephone Encounter (Signed)
I have completed refill request for the patient.

## 2021-09-23 NOTE — Telephone Encounter (Signed)
Pt is being told by the pharmacy that she is out of refills on her Erenumab-aooe (AIMOVIG) 140 MG/ML SOAJ, she is asking for to get this resolved.

## 2021-09-28 ENCOUNTER — Telehealth: Payer: Self-pay | Admitting: Family Medicine

## 2021-09-28 NOTE — Telephone Encounter (Signed)
Pt called states she still hasn't been able to get refill for her Erenumab-aooe (AIMOVIG) 140 MG/ML SOAJ. States a PA is needed.

## 2021-09-28 NOTE — Telephone Encounter (Signed)
Previous PA completed was approved until 10/30/21. We will need to check w/ pharmacy on this.

## 2021-09-28 NOTE — Telephone Encounter (Addendum)
[facility] and spoke with tech. States ready for pick up. High copay 470.00. (this is with insurance/copay card). She has high deductible to meet for prescriptions.  Check w/ insurance to make sure [facility]? May charge more if not preferred pharmacy.    LVM for pt to call office back to further discuss.

## 2021-09-29 NOTE — Telephone Encounter (Signed)
LVM for pt to call office back.

## 2021-11-25 ENCOUNTER — Encounter: Payer: Self-pay | Admitting: Family Medicine

## 2021-11-25 ENCOUNTER — Telehealth: Payer: Self-pay | Admitting: Neurology

## 2021-11-25 NOTE — Telephone Encounter (Signed)
PA attempted on [facility] and was advised to contact the [facility]  to initiate PA. Called [phone] to initiate. She started the process and advised a form will be faxed to our office to be completed. Ticket # is [phone]  Will await a form to come to [place] to be completed.

## 2021-11-30 NOTE — Telephone Encounter (Signed)
I received the paperwork and it was completed and faxed to [facility] for the pt.

## 2021-12-07 NOTE — Telephone Encounter (Signed)
Received an approval for the pt through [employer] # [id number] PA approved from 12/04/2021-06/03/2022

## 2022-03-04 NOTE — Patient Instructions (Incomplete)

## 2022-03-04 NOTE — Progress Notes (Deleted)
No chief complaint on file.   HISTORY OF PRESENT ILLNESS:  03/04/22 ALL:  Tracy Holder returns for follow up for migraines. She continues Amovig and sumatriptan.   03/05/2021 ALL:  Tracy Holder is a 56 y.o. female here today for follow up for migraines. She continues Amovig monthly and sumatriptan for abortive therapy. No longer on Botox therapy (was effective but insurance would not cover). She is doing very well. She feels Amovig has been a miracle drug. She has about 1 migraine per month. Sumatriptan works well for abortive therapy. She is very happy with how well migraines are managed.   HISTORY (copied from Dr Trevor Mace previous note)  Interval history 12/26/2019: The mask gives her a headache, she has been on Aimovig and over the last year she has 15 headache days a month and 10 migraine sdays a month. We will stop the Aimovig. No aura. No medicaton overuse. Botox in the past worked extremely well.    Interval history 09/13/2018: She takes imitrex po, injections worked great a year ago. She has 20 headaches days a month and 10 of those are migrainous > 28months. No aura. No medication overuse. Tried Botox n the past and it worked but Health visitor it. She has tried and failed multiple medications. Will try Aimovig. Botox would be much preferable she did excellent on botox.    She has tried and failed Topamax, Lexapro, cyclobenzaprine, fluoxetine. Wellbutrin. Neurontin. Zoloft.  Propranolol.  At least 5 days a week for the 6 months, 20 migraine days a month, they last 18-24 hours a day sometimes longer. They are debilitating 10/10 pain. Imitrex helps but doesn't totally take away the pain. Getting worse. Left side, throbbing, whole head with pressure, +light sensitivity, +sound sensitivity. Movement makes it worse. Has to go into a dark room. +nausea. She does not take OTC medications more than 2 days per week or 2x a day.   HPI:  Tracy Holder is a 56 y.o. female here as a referral from  Dr. Kateri Plummer for Trigeminal Neuralgia   Left facial numbness and tingling been going on for 6 months. Just started one day, no inciting factors. Went to the ENT because she thought it was sinus related. ENT ruled out any sinus infections. She has numbness on the left side of the face around the eye and cheek to the left ear, sometimes tingly. Continuous, waxes and wanes as far as the severity. Unknown triggers or what makes it better or worse. Her left eye really bothers her like pressure. No shooting pains, no electric, shock-like or stabbing pains. She was scoped by ENT and she was given an antibiotic which significantly helped but 2 weeks afterwards the symptoms came back. The symptoms can become 8-9/10 severe pain. She gets a heaviness in left face, puffiness. She has some neck pain. No other focal or otherwise neurologic symptoms. Allergy meds didn't help.    She has a history of migraines. They have been going on for years. The past year they are worse. At least 5 days a week for the last year, they last 6-12 hours a day sometimes longer. They are debilitating 10/10 pain. Imitrex helps but doesn't totally take away the pain. Getting worse. Left side, throbbing, whole head with pressure, +light sensitivity, +sound sensitivity. Has to go into a dark room. +nausea. She does not take OTC medications more than 2 days per week or 2x a day. She is on Lexapro.  She has tried and failed Topamax, cyclobenzaprine,  fluoxetine.    Shingles 10 years ago in the same left face area. But these symptoms didn't start until 6 months ago.   Reviewed notes, labs and imaging from outside physicians, which showed: Personally reviewed MRi of the brain, no large ischemic strokes, no tumors or masses, normal architecture, nothing in the pons area or cp angle compressing CN5. ENT notes stae that endoscopy did not show etiology for symptoms. Notes state symptoms for 1.5 years.   REVIEW OF SYSTEMS: Out of a complete 14 system  review of symptoms, the patient complains only of the following symptoms, headaches and all other reviewed systems are negative.   ALLERGIES: No Known Allergies   HOME MEDICATIONS: Outpatient Medications Prior to Visit  Medication Sig Dispense Refill   buPROPion (WELLBUTRIN XL) 150 MG 24 hr tablet Take 150 mg by mouth daily.   0   [facility] REL[facility]D 5-120 MG tablet TAKE 1 TABLET BY MOUTH EVERY DAY 30 tablet 5   Erenumab-aooe (AIMOVIG) 140 MG/ML SOAJ Inject 140 mg into the skin every 30 (thirty) days. 1 mL 6   SUMAtriptan (IMITREX) 100 MG tablet TAKE 1 TAB AT START OF HEADACHE. MAY REPEAT IN 2 HOURS. DONT TAKE MORE THEN 2 A DAY OR 2 DAYS WEEK. Appointment needed for further refills. 10 tablet 11   No facility-administered medications prior to visit.     PAST MEDICAL HISTORY: Past Medical History:  Diagnosis Date   Migraine      PAST SURGICAL HISTORY: Past Surgical History:  Procedure Laterality Date   BACK SURGERY     HARRINGTON ROD   CESAREAN SECTION     x2     FAMILY HISTORY: Family History  Problem Relation Age of Onset   Migraines Neg Hx      SOCIAL HISTORY: Social History   Socioeconomic History   Marital status: Married    Spouse name: Jody    Number of children: 2   Years of education: MASTERS   Highest education level: Not on file  Occupational History   Not on file  Tobacco Use   Smoking status: Never   Smokeless tobacco: Never  Vaping Use   Vaping Use: Never used  Substance and Sexual Activity   Alcohol use: Yes    Comment: 1-2 WEEK   Drug use: No   Sexual activity: Not on file  Other Topics Concern   Not on file  Social History Narrative   Patient lives at home with husband   Patient is right handed.    Right handed   Caffeine: 1 cup/day   Social Determinants of Health   Financial Resource Strain: Not on file  Food Insecurity: Not on file  Transportation Needs: Not on file  Physical Activity: Not on file  Stress: Not on file   Social Connections: Not on file  Intimate Partner Violence: Not on file      PHYSICAL EXAM  There were no vitals filed for this visit.  There is no height or weight on file to calculate BMI.   Generalized: Well developed, in no acute distress  Cardiology: normal rate and rhythm, no murmur auscultated  Respiratory: clear to auscultation bilaterally    Neurological examination  Mentation: Alert oriented to time, place, history taking. Follows all commands speech and language fluent Cranial nerve II-XII: Pupils were equal round reactive to light. Extraocular movements were full, visual field were full on confrontational test. Facial sensation and strength were normal. Head turning and shoulder shrug  were normal  and symmetric. Motor: The motor testing reveals 5 over 5 strength of all 4 extremities. Good symmetric motor tone is noted throughout.  Gait and station: Gait is normal.     DIAGNOSTIC DATA (LABS, IMAGING, TESTING) - I reviewed patient records, labs, notes, testing and imaging myself where available.  Lab Results  Component Value Date   WBC 5.1 10/16/2014   HGB 12.8 10/16/2014   HCT 37.5 10/16/2014   MCV 93 10/16/2014   PLT 228 10/16/2014      Component Value Date/Time   NA 139 10/16/2014 1146   K 3.8 10/16/2014 1146   CL 102 10/16/2014 1146   CO2 24 10/16/2014 1146   GLUCOSE 60 (L) 10/16/2014 1146   BUN 12 10/16/2014 1146   CREATININE 0.78 10/16/2014 1146   CALCIUM 9.2 10/16/2014 1146   PROT 6.7 10/16/2014 1146   ALBUMIN 4.4 10/16/2014 1146   AST 22 10/16/2014 1146   ALT 12 10/16/2014 1146   ALKPHOS 59 10/16/2014 1146   BILITOT 0.3 10/16/2014 1146   GFRNONAA 91 10/16/2014 1146   GFRAA 105 10/16/2014 1146   No results found for: CHOL, HDL, LDLCALC, LDLDIRECT, TRIG, CHOLHDL No results found for: EHMC9O No results found for: VITAMINB12 No results found for: TSH      View : No data to display.               View : No data to display.            ASSESSMENT AND PLAN  56 y.o. year old female  has a past medical history of Migraine. here with     No diagnosis found.  Tracy Holder is doing very well on Amovig and sumatriptan. We will continue current treatment plan. She will continue healthy lifestyle habits. She will follow up with PCP as directed. Follow up with me in 1 year, sooner if needed.    No orders of the defined types were placed in this encounter.    No orders of the defined types were placed in this encounter.   Shawnie Dapper, MSN, FNP-C 03/04/2022, 3:30 PM  [facility] [address] [place], [place] [address] [phone]

## 2022-03-08 ENCOUNTER — Telehealth: Payer: Self-pay | Admitting: Family Medicine

## 2022-03-08 ENCOUNTER — Ambulatory Visit: Payer: Self-pay | Admitting: Family Medicine

## 2022-03-08 DIAGNOSIS — G43709 Chronic migraine without aura, not intractable, without status migrainosus: Secondary | ICD-10-CM

## 2022-03-08 NOTE — Telephone Encounter (Signed)
NP out- LVM and sent mychart message asking pt to call us back and r/s. ?

## 2022-03-11 ENCOUNTER — Other Ambulatory Visit: Payer: Self-pay

## 2022-03-11 DIAGNOSIS — G43709 Chronic migraine without aura, not intractable, without status migrainosus: Secondary | ICD-10-CM

## 2022-03-11 MED ORDER — SUMATRIPTAN SUCCINATE 100 MG PO TABS: ORAL_TABLET | ORAL | 1 refills | Status: DC

## 2022-04-12 ENCOUNTER — Other Ambulatory Visit: Payer: Self-pay | Admitting: Family Medicine

## 2022-04-12 DIAGNOSIS — I719 Aortic aneurysm of unspecified site, without rupture: Secondary | ICD-10-CM

## 2022-06-15 ENCOUNTER — Telehealth: Payer: Self-pay | Admitting: Family Medicine

## 2022-06-15 DIAGNOSIS — G43709 Chronic migraine without aura, not intractable, without status migrainosus: Secondary | ICD-10-CM

## 2022-06-15 MED ORDER — SUMATRIPTAN SUCCINATE 100 MG PO TABS: ORAL_TABLET | ORAL | 1 refills | Status: DC

## 2022-06-15 NOTE — Telephone Encounter (Signed)
Pt was called and put on the schedule for her Medication Management.

## 2022-06-15 NOTE — Telephone Encounter (Signed)
Pt called needing a refill request on her SUMAtriptan (IMITREX) 100 MG tablet [facility] [address]

## 2022-06-15 NOTE — Telephone Encounter (Signed)
Refill has been sent pending pt's September appt .

## 2022-06-30 ENCOUNTER — Ambulatory Visit
Admission: RE | Admit: 2022-06-30 | Discharge: 2022-06-30 | Disposition: A | Discharge status: Home / Self Care | Payer: PRIVATE HEALTH INSURANCE | Source: Ambulatory Visit | Attending: Family Medicine | Admitting: Family Medicine

## 2022-06-30 DIAGNOSIS — I719 Aortic aneurysm of unspecified site, without rupture: Secondary | ICD-10-CM

## 2022-06-30 MED ORDER — IOPAMIDOL (ISOVUE-370) INJECTION 76%
75.0000 mL | Freq: Once | INTRAVENOUS | Status: AC | PRN
  Administered 2022-06-30: 75 mL via INTRAVENOUS

## 2022-07-20 ENCOUNTER — Telehealth: Payer: Self-pay | Admitting: Family Medicine

## 2022-07-20 DIAGNOSIS — G43709 Chronic migraine without aura, not intractable, without status migrainosus: Secondary | ICD-10-CM

## 2022-07-20 MED ORDER — AIMOVIG 140 MG/ML ~~LOC~~ SOAJ: 140.0000 mg | SUBCUTANEOUS | 0 refills | Status: DC

## 2022-07-20 NOTE — Telephone Encounter (Signed)
E-scribed refill to pharmacy. Pt has upcoming appt 07/28/22.

## 2022-07-20 NOTE — Telephone Encounter (Signed)
Pt request refill forErenumab-aooe (AIMOVIG) 140 MG/ML [id number]

## 2022-07-28 ENCOUNTER — Telehealth (INDEPENDENT_AMBULATORY_CARE_PROVIDER_SITE_OTHER): Payer: PRIVATE HEALTH INSURANCE | Admitting: Neurology

## 2022-07-28 DIAGNOSIS — G43709 Chronic migraine without aura, not intractable, without status migrainosus: Secondary | ICD-10-CM

## 2022-07-28 MED ORDER — AIMOVIG 140 MG/ML ~~LOC~~ SOAJ
140.0000 mg | SUBCUTANEOUS | 0 refills | Status: DC
Start: 2022-07-28 — End: 2023-08-24

## 2022-07-28 NOTE — Progress Notes (Unsigned)
No chief complaint on file.  07/28/2022: Here for follow up of migrianes. She has been on Aimovig and sumatriptan for several years and has been doing well. She has not gotten her aimovig for 2 months. She is happy on the aimovig. And sumatriptan off the aimovig for several months due to insurance.    HISTORY OF PRESENT ILLNESS: 03/05/2021 Tracy Presto, MSN, FNP-C :  Tracy Holder is a 56 y.o. female here today for follow up for migraines. She continues Amovig monthly and sumatriptan for abortive therapy. No longer on Botox therapy (was effective but insurance would not cover). She is doing very well. She feels Amovig has been a miracle drug. She has about 1 migraine per month. Sumatriptan works well for abortive therapy. She is very happy with how well migraines are managed.     HISTORY (copied from Dr Cathren Laine previous note)  Interval history 12/26/2019: The mask gives her a headache, she has been on Aimovig and over the last year she has 15 headache days a month and 10 migraine sdays a month. We will stop the Aimovig. No aura. No medicaton overuse. Botox in the past worked extremely well.    Interval history 09/13/2018: She takes imitrex po, injections worked great a year ago. She has 20 headaches days a month and 10 of those are migrainous > 54months. No aura. No medication overuse. Tried Botox n the past and it worked but Personal assistant it. She has tried and failed multiple medications. Will try Aimovig. Botox would be much preferable she did excellent on botox.    She has tried and failed Topamax, Lexapro, cyclobenzaprine, fluoxetine. Wellbutrin. Neurontin. Zoloft.  Propranolol.  At least 5 days a week for the 6 months, 20 migraine days a month, they last 18-24 hours a day sometimes longer. They are debilitating 10/10 pain. Imitrex helps but doesn't totally take away the pain. Getting worse. Left side, throbbing, whole head with pressure, +light sensitivity, +sound sensitivity. Movement  makes it worse. Has to go into a dark room. +nausea. She does not take OTC medications more than 2 days per week or 2x a day.   HPI:  Tracy Holder is a 56 y.o. female here as a referral from Dr. Orland Mustard for Trigeminal Neuralgia   Left facial numbness and tingling been going on for 6 months. Just started one day, no inciting factors. Went to the ENT because she thought it was sinus related. ENT ruled out any sinus infections. She has numbness on the left side of the face around the eye and cheek to the left ear, sometimes tingly. Continuous, waxes and wanes as far as the severity. Unknown triggers or what makes it better or worse. Her left eye really bothers her like pressure. No shooting pains, no electric, shock-like or stabbing pains. She was scoped by ENT and she was given an antibiotic which significantly helped but 2 weeks afterwards the symptoms came back. The symptoms can become 8-9/10 severe pain. She gets a heaviness in left face, puffiness. She has some neck pain. No other focal or otherwise neurologic symptoms. Allergy meds didn't help.    She has a history of migraines. They have been going on for years. The past year they are worse. At least 5 days a week for the last year, they last 6-12 hours a day sometimes longer. They are debilitating 10/10 pain. Imitrex helps but doesn't totally take away the pain. Getting worse. Left side, throbbing, whole head with pressure, +light sensitivity, +  sound sensitivity. Has to go into a dark room. +nausea. She does not take OTC medications more than 2 days per week or 2x a day. She is on Lexapro.  She has tried and failed Topamax, cyclobenzaprine, fluoxetine.    Shingles 10 years ago in the same left face area. But these symptoms didn't start until 6 months ago.   Reviewed notes, labs and imaging from outside physicians, which showed: Personally reviewed MRi of the brain, no large ischemic strokes, no tumors or masses, normal architecture, nothing in the  pons area or cp angle compressing CN5. ENT notes stae that endoscopy did not show etiology for symptoms. Notes state symptoms for 1.5 years.    REVIEW OF SYSTEMS: Out of a complete 14 system review of symptoms, the patient complains only of the following symptoms, headaches and all other reviewed systems are negative.    ALLERGIES: No Known Allergies   HOME MEDICATIONS: Outpatient Medications Prior to Visit  Medication Sig Dispense Refill   buPROPion (WELLBUTRIN XL) 150 MG 24 hr tablet Take 150 mg by mouth daily.   0   [facility] ALLERGY RELIEF-D 5-120 MG tablet TAKE 1 TABLET BY MOUTH EVERY DAY 30 tablet 5   Erenumab-aooe (AIMOVIG) 140 MG/ML SOAJ Inject 140 mg into the skin every 30 (thirty) days. Must keep appt 07/28/22 for ongoing refills 1 mL 0   SUMAtriptan (IMITREX) 100 MG tablet TAKE 1 TAB AT START OF HEADACHE. MAY REPEAT IN 2 HOURS. DONT TAKE MORE THEN 2 A DAY OR 2 DAYS WEEK. Appointment needed for further refills. 10 tablet 1   No facility-administered medications prior to visit.     PAST MEDICAL HISTORY: Past Medical History:  Diagnosis Date   Migraine      PAST SURGICAL HISTORY: Past Surgical History:  Procedure Laterality Date   BACK SURGERY     HARRINGTON ROD   CESAREAN SECTION     x2     FAMILY HISTORY: Family History  Problem Relation Age of Onset   Migraines Neg Hx      SOCIAL HISTORY: Social History   Socioeconomic History   Marital status: Married    Spouse name: Jody    Number of children: 2   Years of education: MASTERS   Highest education level: Not on file  Occupational History   Not on file  Tobacco Use   Smoking status: Never   Smokeless tobacco: Never  Vaping Use   Vaping Use: Never used  Substance and Sexual Activity   Alcohol use: Yes    Comment: 1-2 WEEK   Drug use: No   Sexual activity: Not on file  Other Topics Concern   Not on file  Social History Narrative   Patient lives at home with husband   Patient is right handed.     Right handed   Caffeine: 1 cup/day   Social Determinants of Health   Financial Resource Strain: Not on file  Food Insecurity: Not on file  Transportation Needs: Not on file  Physical Activity: Not on file  Stress: Not on file  Social Connections: Not on file  Intimate Partner Violence: Not on file      PHYSICAL EXAM  There were no vitals filed for this visit.  There is no height or weight on file to calculate BMI.   Generalized: Well developed, in no acute distress  Cardiology: normal rate and rhythm, no murmur auscultated  Respiratory: clear to auscultation bilaterally    Neurological examination  Mentation: Alert oriented  to time, place, history taking. Follows all commands speech and language fluent Cranial nerve II-XII: Pupils were equal round reactive to light. Extraocular movements were full, visual field were full on confrontational test. Facial sensation and strength were normal. Head turning and shoulder shrug  were normal and symmetric. Motor: The motor testing reveals 5 over 5 strength of all 4 extremities. Good symmetric motor tone is noted throughout.  Gait and station: Gait is normal.     DIAGNOSTIC DATA (LABS, IMAGING, TESTING) - I reviewed patient records, labs, notes, testing and imaging myself where available.  Lab Results  Component Value Date   WBC 5.1 10/16/2014   HGB 12.8 10/16/2014   HCT 37.5 10/16/2014   MCV 93 10/16/2014   PLT 228 10/16/2014      Component Value Date/Time   NA 139 10/16/2014 1146   K 3.8 10/16/2014 1146   CL 102 10/16/2014 1146   CO2 24 10/16/2014 1146   GLUCOSE 60 (L) 10/16/2014 1146   BUN 12 10/16/2014 1146   CREATININE 0.78 10/16/2014 1146   CALCIUM 9.2 10/16/2014 1146   PROT 6.7 10/16/2014 1146   ALBUMIN 4.4 10/16/2014 1146   AST 22 10/16/2014 1146   ALT 12 10/16/2014 1146   ALKPHOS 59 10/16/2014 1146   BILITOT 0.3 10/16/2014 1146   GFRNONAA 91 10/16/2014 1146   GFRAA 105 10/16/2014 1146   No results  found for: "CHOL", "HDL", "LDLCALC", "LDLDIRECT", "TRIG", "CHOLHDL" No results found for: "HGBA1C" No results found for: "VITAMINB12" No results found for: "TSH"      No data to display               No data to display           ASSESSMENT AND PLAN  56 y.o. year old female  has a past medical history of Migraine. here with     No diagnosis found.  Zuliana is doing very well on Amovig and sumatriptan. We will continue current treatment plan. She will continue healthy lifestyle habits. She will follow up with PCP as directed. Follow up with me in 1 year, sooner if needed.    No orders of the defined types were placed in this encounter.    No orders of the defined types were placed in this encounter.     [facility] [address], [place], [place] [address] [phone]  agree with assessment and plan as stated.     Sarina Ill, MD [facility]

## 2022-07-29 MED ORDER — SUMATRIPTAN SUCCINATE 100 MG PO TABS: ORAL_TABLET | ORAL | 11 refills | Status: DC

## 2022-07-29 MED ORDER — AIMOVIG 140 MG/ML ~~LOC~~ SOAJ: 140.0000 mg | SUBCUTANEOUS | 11 refills | Status: DC

## 2022-08-03 ENCOUNTER — Encounter: Payer: Self-pay | Admitting: Neurology

## 2022-08-04 ENCOUNTER — Telehealth: Payer: Self-pay | Admitting: *Deleted

## 2022-08-04 NOTE — Telephone Encounter (Signed)
I called [facility].  Spoke to [place].  [facility] on [facility]-21-2022.  Made a test claim for 08/13/2022 and is able to  fill , requiring no PA.  Ref # is pts name and ID #.

## 2022-08-04 NOTE — Telephone Encounter (Signed)
-----   Message from Melvenia Beam, MD sent at 07/29/2022  9:30 AM EDT ----- Regarding: aimovig Will you let me know what happens with Tracy Holder's aimovig approval? If she fdoesn't get approved I will have to change it, gave her 3 samples yesterday thanks

## 2022-09-21 IMAGING — CR DG CLAVICLE*R*
2 series · 2 of 2 positions shown · non-contrast
Comparison: None.

CLINICAL DATA: Proximal clavicular pain for 2 months. No known
injury.

EXAM:
RIGHT CLAVICLE - 2+ VIEWS

[w clavicle ap right]
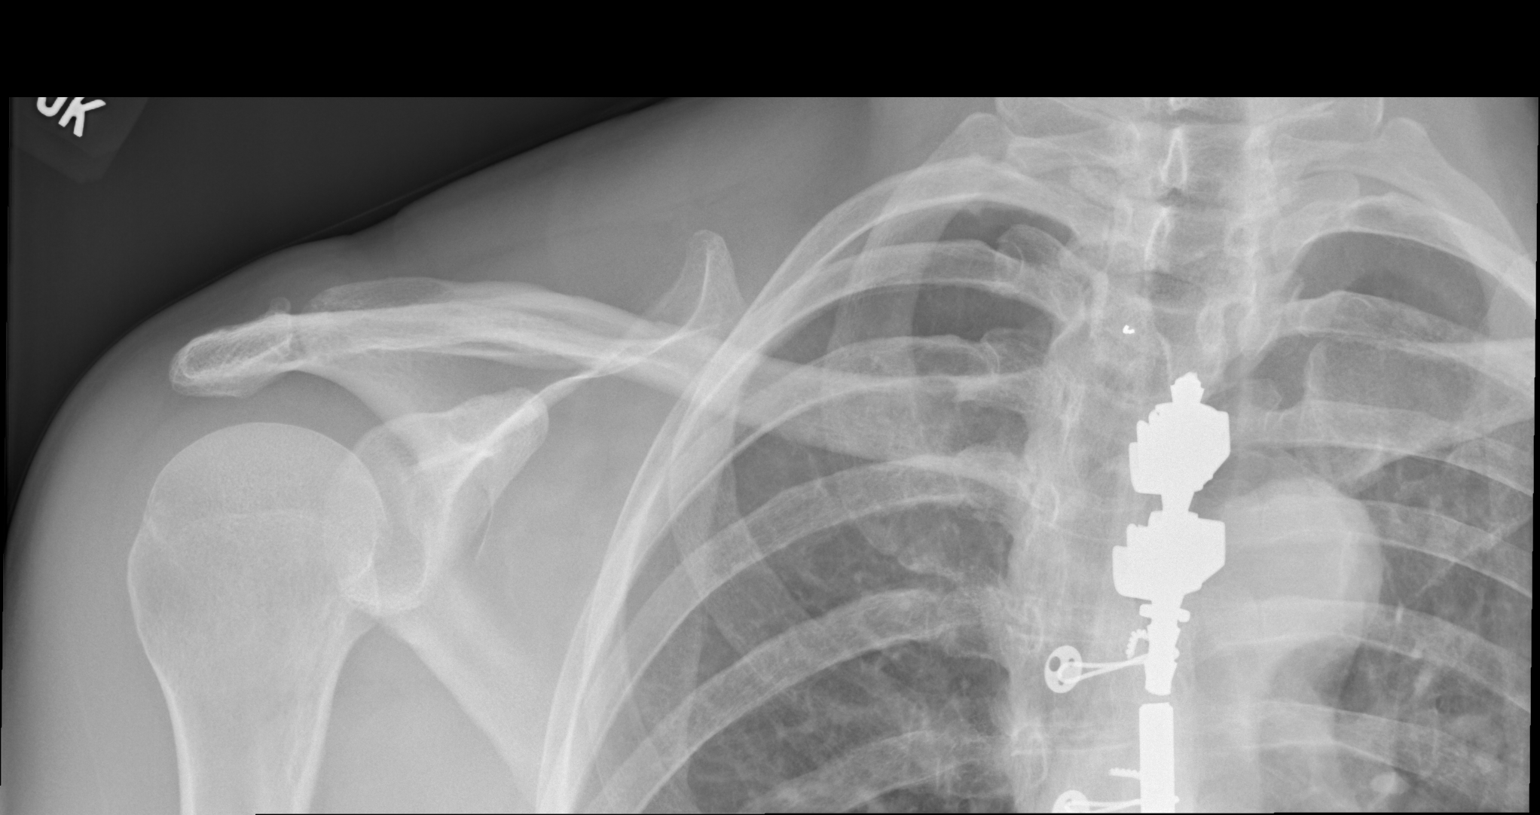

[w clavicle tangential right]
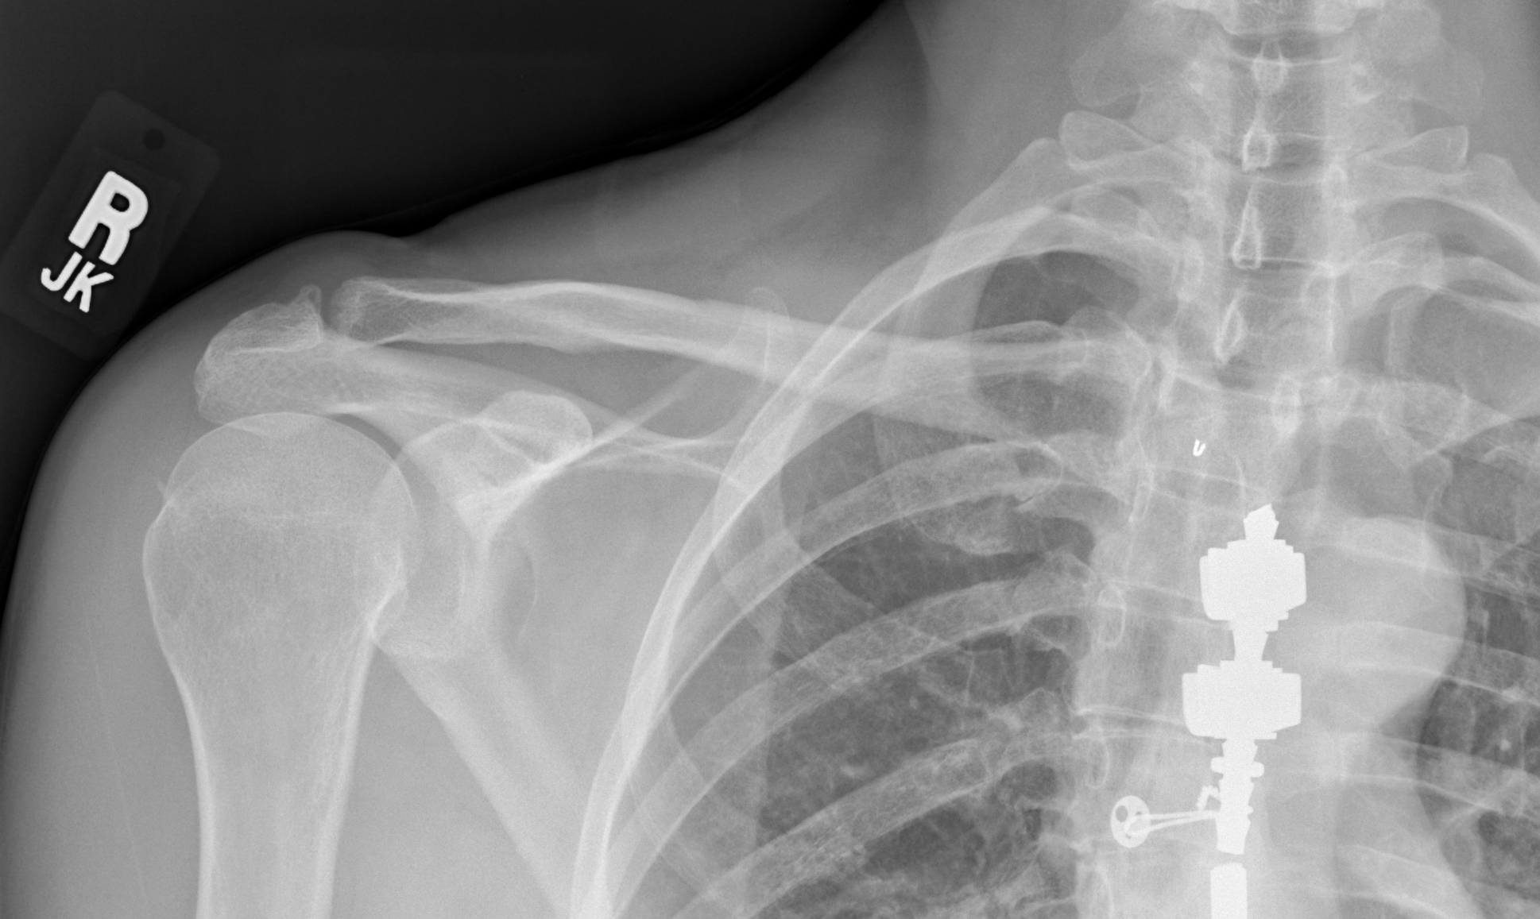

[2 of 2 positions shown; findings below may reference images not displayed]

FINDINGS: Degenerative changes in the acromioclavicular joint. Normal
alignment. No evidence of acute fracture or dislocation. No focal
bone lesions. Postoperative changes in the spine.
IMPRESSION: Degenerative changes in the acromioclavicular joint. No acute bony
abnormalities.

## 2022-11-02 ENCOUNTER — Encounter: Payer: Self-pay | Admitting: Neurology

## 2022-11-06 IMAGING — CT CT CHEST W/O CM
2 of 4 series · 15 of 36 positions shown, 18 images · non-contrast
Comparison: Abdominal CT 07/12/2014

CLINICAL DATA: Left shoulder pain.

EXAM:
CT CHEST WITHOUT CONTRAST
TECHNIQUE: Multidetector CT imaging of the chest was performed following the
standard protocol without IV contrast.

[Series 4: chest 2.00 br40 s3 · axial · 0.60mm/px · z∈[+1497,+1751]mm · 12 of 151 slices shown, 15 images (1 of 2)]
[im 12/151  mediastinal]
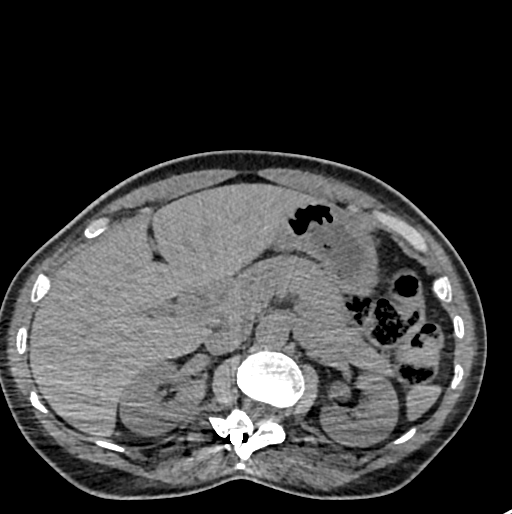
[im 12/151  lung]
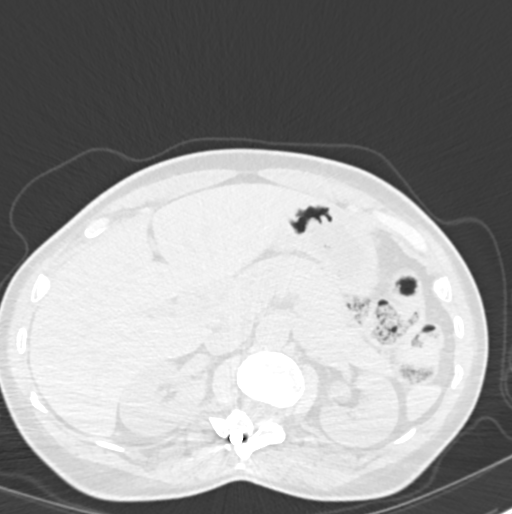
[im 24/151  lung]
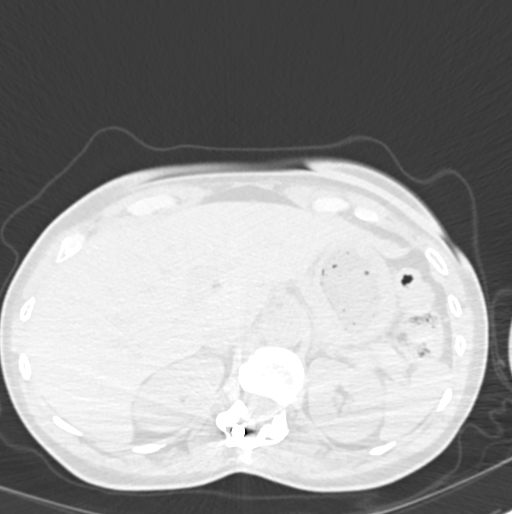
[im 35/151  lung]
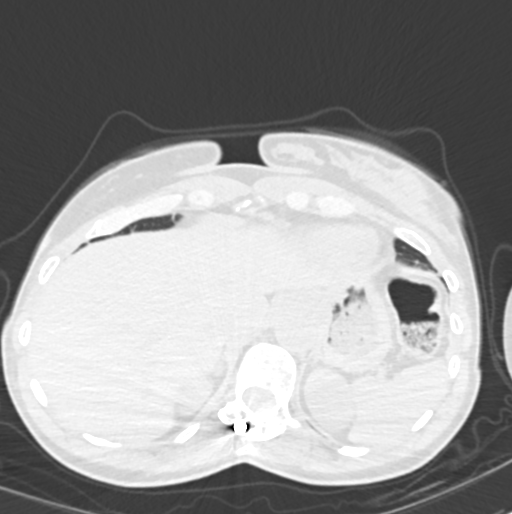
[im 47/151  lung]
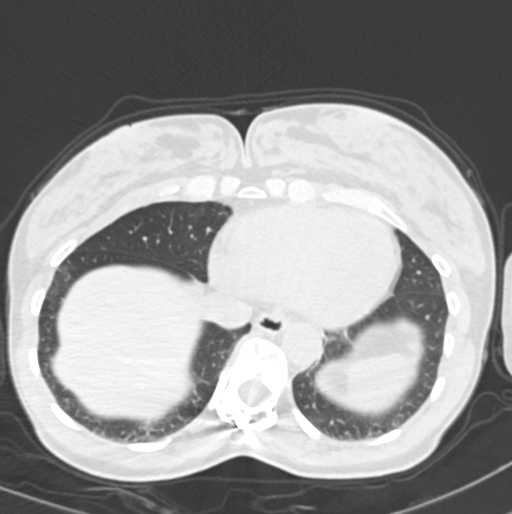
[im 58/151  mediastinal]
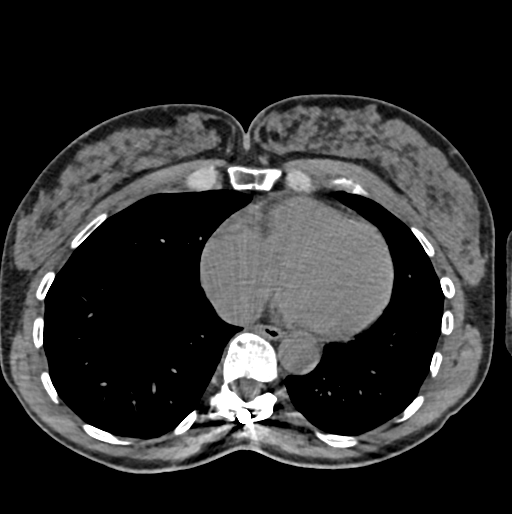
[im 58/151  lung]
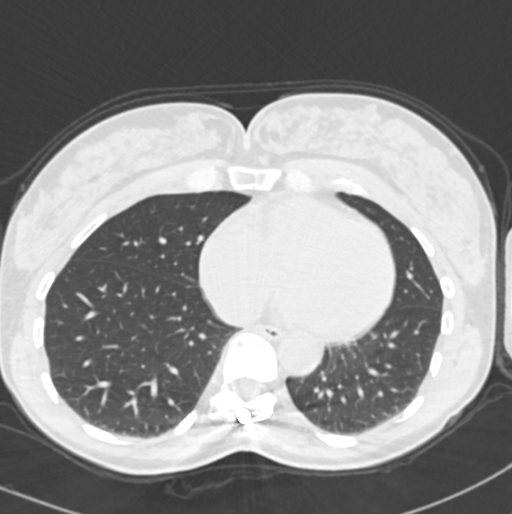
[im 70/151  lung]
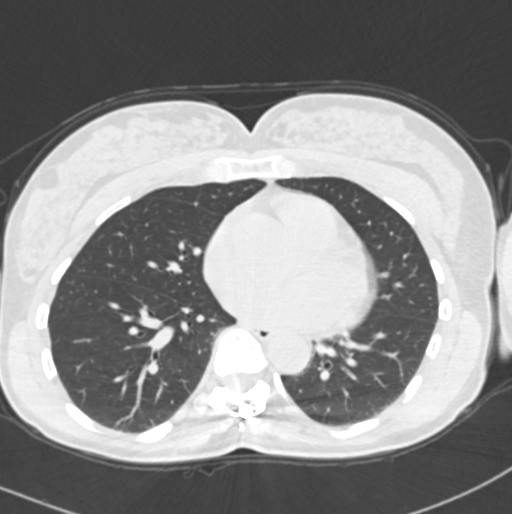
[im 81/151  lung]
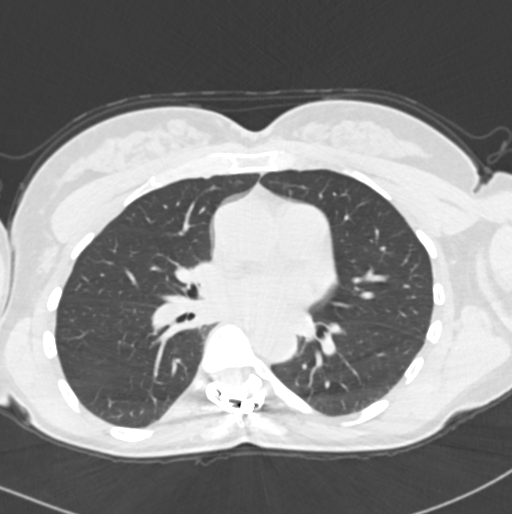
[im 93/151  lung]
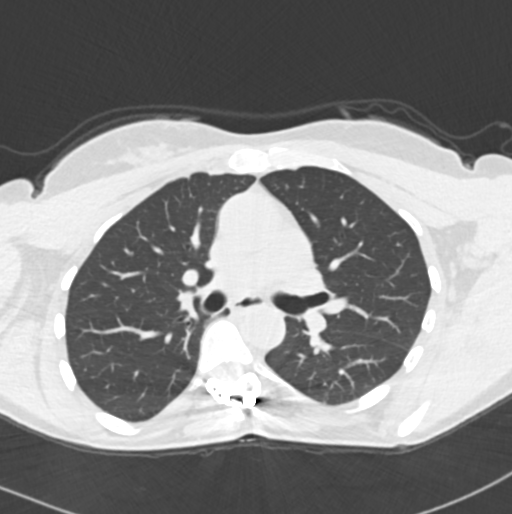
[im 104/151  mediastinal]
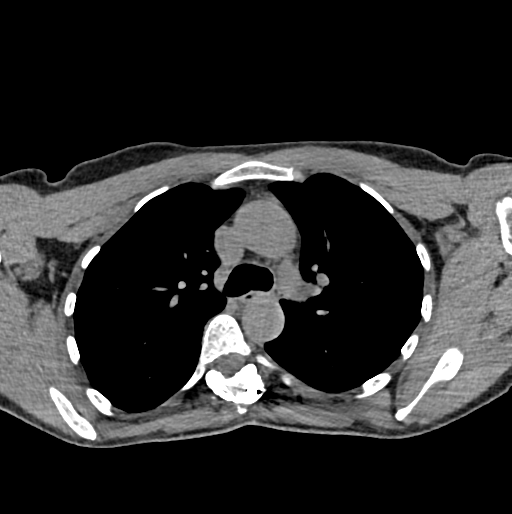
[im 104/151  lung]
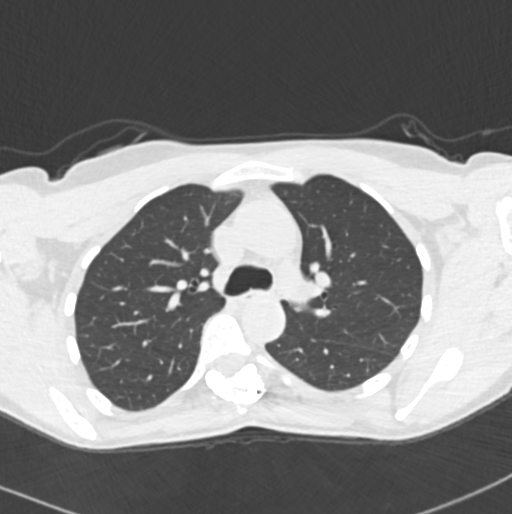
[im 116/151  lung]
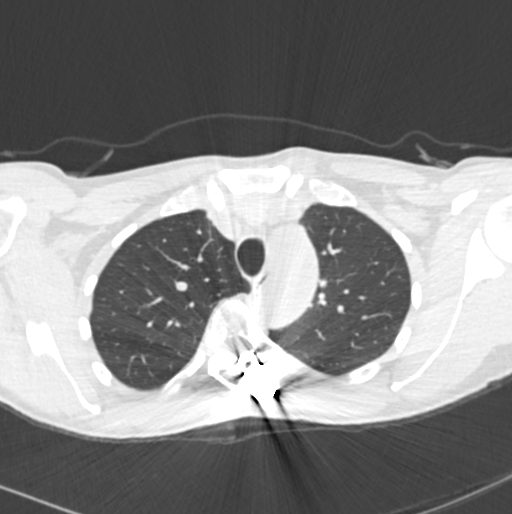
[im 127/151  lung]
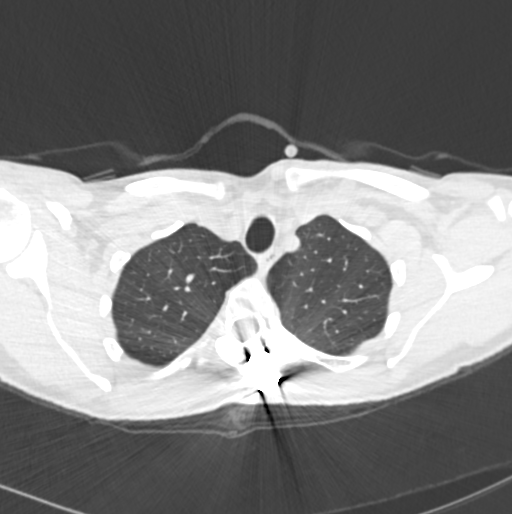
[im 139/151  lung]
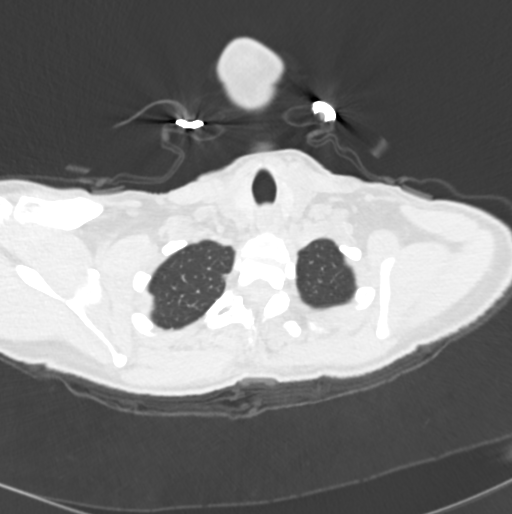

[Series 6: chest 2.00 br40 s3 · coronal · 0.59mm/px · 3 of 155 slices shown (2 of 2)]
[im 31/155  lung]
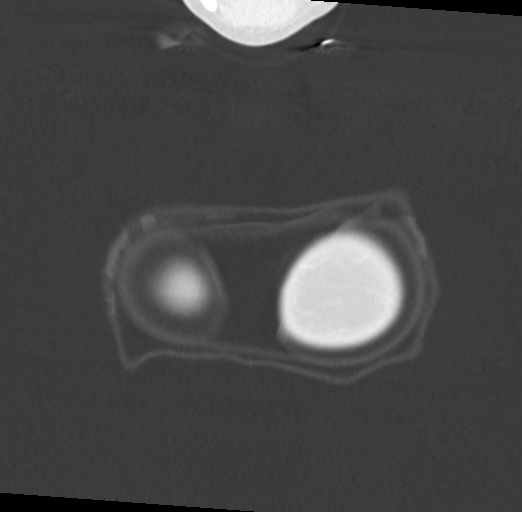
[im 62/155  lung]
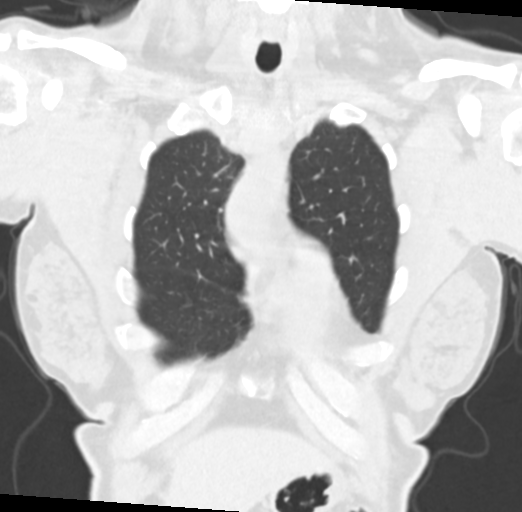
[im 93/155  lung]
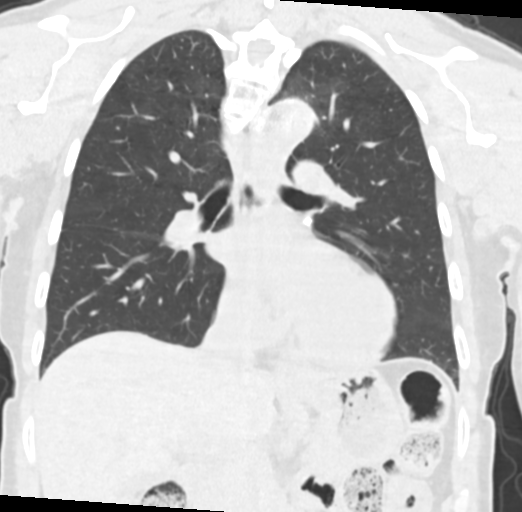

[15 of 36 positions shown; findings below may reference images not displayed]

FINDINGS: Cardiovascular: Heart is normal size. Mild ectasia of the ascending
thoracic aorta measuring 3.5 cm in AP diameter. Remaining vascular
structures are unremarkable.

Mediastinum/Nodes: No mediastinal or hilar adenopathy. Calcified
left hilar lymph node is present. 1.1 cm right pericardiophrenic
lymph node stable since 8735. Remaining mediastinal structures are
unremarkable.

Lungs/Pleura: Lungs are adequately inflated without consolidation or
effusion. Airways are normal.

Upper Abdomen: 3-4 mm nonobstructing stone over the mid pole left
kidney. Small cyst not optimally visualized on this non-contrast
study over the mid pole right kidney. No acute findings.

Musculoskeletal: Biphasic curvature of the thoracolumbar spine with
posterior stabilization hardware in place from the upper thoracic
spine extending into the lumbar spine and off the film. Minimal
degenerate change of the left AC joint and left sternoclavicular
joints.
IMPRESSION: 1. No acute cardiopulmonary disease.
2. 3-4 mm nonobstructing left renal stone. Small right renal cyst
not optimally visualized on this non-contrast study.
3. Mild ectasia of the ascending thoracic aorta measuring 3.5 cm in
AP diameter. Recommend annual imaging followup by CTA or MRA. This
recommendation follows 2363
ACCF/AHA/AATS/ACR/ASA/SCA/PERFUNDI/VOLF/MEET/HERMANSON Guidelines for the
Diagnosis and Management of Patients with Thoracic Aortic Disease.
Circulation.2363; 121: E266-e369. Aortic aneurysm NOS ([id number].B).

## 2023-07-15 ENCOUNTER — Encounter: Payer: Self-pay | Admitting: Family Medicine

## 2023-07-15 ENCOUNTER — Other Ambulatory Visit: Payer: Self-pay | Admitting: Family Medicine

## 2023-07-15 DIAGNOSIS — I77819 Aortic ectasia, unspecified site: Secondary | ICD-10-CM

## 2023-08-21 ENCOUNTER — Other Ambulatory Visit: Payer: Self-pay | Admitting: Neurology

## 2023-08-21 DIAGNOSIS — G43709 Chronic migraine without aura, not intractable, without status migrainosus: Secondary | ICD-10-CM

## 2023-08-22 ENCOUNTER — Telehealth: Payer: Self-pay | Admitting: Neurology

## 2023-08-22 DIAGNOSIS — G43709 Chronic migraine without aura, not intractable, without status migrainosus: Secondary | ICD-10-CM

## 2023-08-22 MED ORDER — SUMATRIPTAN SUCCINATE 100 MG PO TABS: ORAL_TABLET | ORAL | 0 refills | Status: DC

## 2023-08-22 NOTE — Telephone Encounter (Signed)
One refill has been sent.

## 2023-08-22 NOTE — Telephone Encounter (Signed)
Pt called and scheduled year f/u appt and also stated that she is needing a refill on her SUMAtriptan (IMITREX) 100 MG tablet and [facility] [address]

## 2023-08-23 NOTE — Patient Instructions (Signed)
Below is our plan:  We will continue sumatriptan as needed. We can consider restarting Amovig in the future if needed.   Please make sure you are staying well hydrated. I recommend 50-60 ounces daily. Well balanced diet and regular exercise encouraged. Consistent sleep schedule with 6-8 hours recommended.   Please continue follow up with care team as directed.   Follow up with me as needed.   You may receive a survey regarding today's visit. I encourage you to leave honest feed back as I do use this information to improve patient care. Thank you for seeing me today!   GENERAL HEADACHE INFORMATION:   Natural supplements: Magnesium Oxide or Magnesium Glycinate 500 mg at bed (up to 800 mg daily) Coenzyme Q10 300 mg in AM Vitamin B2- 200 mg twice a day   Add 1 supplement at a time since even natural supplements can have undesirable side effects. You can sometimes buy supplements cheaper (especially Coenzyme Q10) at www.WebmailGuide.co.za or at [facility].  Migraine with aura: There is increased risk for stroke in women with migraine with aura and a contraindication for the combined contraceptive pill for use by women who have migraine with aura. The risk for women with migraine without aura is lower. However other risk factors like smoking are far more likely to increase stroke risk than migraine. There is a recommendation for no smoking and for the use of OCPs without estrogen such as progestogen only pills particularly for women with migraine with aura.Marland Kitchen People who have migraine headaches with auras may be 3 times more likely to have a stroke caused by a blood clot, compared to migraine patients who don't see auras. Women who take hormone-replacement therapy may be 30 percent more likely to suffer a clot-based stroke than women not taking medication containing estrogen. Other risk factors like smoking and high blood pressure may be  much more important.    Vitamins and herbs that show potential:    Magnesium: Magnesium (250 mg twice a day or 500 mg at bed) has a relaxant effect on smooth muscles such as blood vessels. Individuals suffering from frequent or daily headache usually have low magnesium levels which can be increase with daily supplementation of 400-750 mg. Three trials found 40-90% average headache reduction  when used as a preventative. Magnesium may help with headaches are aura, the best evidence for magnesium is for migraine with aura is its thought to stop the cortical spreading depression we believe is the pathophysiology of migraine aura.Magnesium also demonstrated the benefit in menstrually related migraine.  Magnesium is part of the messenger system in the serotonin cascade and it is a good muscle relaxant.  It is also useful for constipation which can be a side effect of other medications used to treat migraine. Good sources include nuts, whole grains, and tomatoes. Side Effects: loose stool/diarrhea  Riboflavin (vitamin B 2) 200 mg twice a day. This vitamin assists nerve cells in the production of ATP a principal energy storing molecule.  It is necessary for many chemical reactions in the body.  There have been at least 3 clinical trials of riboflavin using 400 mg per day all of which suggested that migraine frequency can be decreased.  All 3 trials showed significant improvement in over half of migraine sufferers.  The supplement is found in bread, cereal, milk, meat, and poultry.  Most Americans get more riboflavin than the recommended daily allowance, however riboflavin deficiency is not necessary for the supplements to help prevent headache. Side effects:  energizing, green urine   Coenzyme Q10: This is present in almost all cells in the body and is critical component for the conversion of energy.  Recent studies have shown that a nutritional supplement of CoQ10 can reduce the frequency of migraine attacks by improving the energy production of cells as with riboflavin.  Doses of  150 mg twice a day have been shown to be effective.   Melatonin: Increasing evidence shows correlation between melatonin secretion and headache conditions.  Melatonin supplementation has decreased headache intensity and duration.  It is widely used as a sleep aid.  Sleep is natures way of dealing with migraine.  A dose of 3 mg is recommended to start for headaches including cluster headache. Higher doses up to 15 mg has been reviewed for use in Cluster headache and have been used. The rationale behind using melatonin for cluster is that many theories regarding the cause of Cluster headache center around the disruption of the normal circadian rhythm in the brain.  This helps restore the normal circadian rhythm.   HEADACHE DIET: Foods and beverages which may trigger migraine Note that only 20% of headache patients are food sensitive. You will know if you are food sensitive if you get a headache consistently 20 minutes to 2 hours after eating a certain food. Only cut out a food if it causes headaches, otherwise you might remove foods you enjoy! What matters most for diet is to eat a well balanced healthy diet full of vegetables and low fat protein, and to not miss meals.   Chocolate, other sweets ALL cheeses except cottage and cream cheese Dairy products, yogurt, sour cream, ice cream Liver Meat extracts (Bovril, Marmite, meat tenderizers) Meats or fish which have undergone aging, fermenting, pickling or smoking. These include: Hotdogs,salami,Lox,sausage, mortadellas,smoked salmon, pepperoni, Pickled herring Pods of broad bean (English beans, Chinese pea pods, [place] (fava) beans, lima and navy beans Ripe avocado, ripe banana Yeast extracts or active yeast preparations such as Brewer's or Fleishman's (commercial bakes goods are permitted) Tomato based foods, pizza (lasagna, etc.)   MSG (monosodium glutamate) is disguised as many things; look for these common aliases: Monopotassium  glutamate Autolysed yeast Hydrolysed protein Sodium caseinate "flavorings" "all natural preservatives" Nutrasweet   Avoid all other foods that convincingly provoke headaches.   Resources: The Dizzy [place]air Laundry Your Headache Diet, migrainestrong.com  https://zamora-andrews.com/   Caffeine and Migraine For patients that have migraine, caffeine intake more than 3 days per week can lead to dependency and increased migraine frequency. I would recommend cutting back on your caffeine intake as best you can. The recommended amount of caffeine is 200-300 mg daily, although migraine patients may experience dependency at even lower doses. While you may notice an increase in headache temporarily, cutting back will be helpful for headaches in the long run. For more information on caffeine and migraine, visit: https://americanmigrainefoundation.org/resource-library/caffeine-and-migraine/   Headache Prevention Strategies:   1. Maintain a headache diary; learn to identify and avoid triggers.  - This can be a simple note where you log when you had a headache, associated symptoms, and medications used - There are several smartphone apps developed to help track migraines: Migraine Buddy, Migraine Monitor, Curelator N1-Headache App   Common triggers include: Emotional triggers: Emotional/Upset family or friends Emotional/Upset occupation Business reversal/success Anticipation anxiety Crisis-serious Post-crisis periodNew job/position   Physical triggers: Vacation Day Weekend Strenuous Exercise High Altitude Location New Move Menstrual Day Physical Illness Oversleep/Not enough sleep Weather changes Light: Photophobia or light sesnitivity treatment involves a  balance between desensitization and reduction in overly strong input. Use dark polarized glasses outside, but not inside. Avoid bright or fluorescent light, but do not dim environment to the point  that going into a normally lit room hurts. Consider FL-41 tint lenses, which reduce the most irritating wavelengths without blocking too much light.  These can be obtained at axonoptics.com or theraspecs.com Foods: see list above.   2. Limit use of acute treatments (over-the-counter medications, triptans, etc.) to no more than 2 days per week or 10 days per month to prevent medication overuse headache (rebound headache).     3. Follow a regular schedule (including weekends and holidays): Don't skip meals. Eat a balanced diet. 8 hours of sleep nightly. Minimize stress. Exercise 30 minutes per day. Being overweight is associated with a 5 times increased risk of chronic migraine. Keep well hydrated and drink 6-8 glasses of water per day.   4. Initiate non-pharmacologic measures at the earliest onset of your headache. Rest and quiet environment. Relax and reduce stress. Breathe2Relax is a free app that can instruct you on    some simple relaxtion and breathing techniques. Http://Dawnbuse.com is a    free website that provides teaching videos on relaxation.  Also, there are  many apps that   can be downloaded for "mindful" relaxation.  An app called YOGA NIDRA will help walk you through mindfulness. Another app called Calm can be downloaded to give you a structured mindfulness guide with daily reminders and skill development. Headspace for guided meditation Mindfulness Based Stress Reduction Online Course: www.palousemindfulness.com Cold compresses.   5. Don't wait!! Take the maximum allowable dosage of prescribed medication at the first sign of migraine.   6. Compliance:  Take prescribed medication regularly as directed and at the first sign of a migraine.   7. Communicate:  Call your physician when problems arise, especially if your headaches change, increase in frequency/severity, or become associated with neurological symptoms (weakness, numbness, slurred speech, etc.). Proceed to emergency room  if you experience new or worsening symptoms or symptoms do not resolve, if you have new neurologic symptoms or if headache is severe, or for any concerning symptom.   8. Headache/pain management therapies: Consider various complementary methods, including medication, behavioral therapy, psychological counselling, biofeedback, massage therapy, acupuncture, dry needling, and other modalities.  Such measures may reduce the need for medications. Counseling for pain management, where patients learn to function and ignore/minimize their pain, seems to work very well.   9. Recommend changing family's attention and focus away from patient's headaches. Instead, emphasize daily activities. If first question of day is 'How are your headaches/Do you have a headache today?', then patient will constantly think about headaches, thus making them worse. Goal is to re-direct attention away from headaches, toward daily activities and other distractions.   10. Helpful Websites: www.AmericanHeadacheSociety.org PatentHood.ch www.headaches.org TightMarket.nl www.achenet.org

## 2023-08-23 NOTE — Progress Notes (Signed)
Chief Complaint  Patient presents with   Follow-up    Pt in room 1. Here for migraine follow up on sumatriptan. Pt reports migraines are good, pt reports 1-2 migraines monthly.      HISTORY OF PRESENT ILLNESS:  08/24/23 ALL:  Tracy Holder returns for follow up for migraines. She was previously on Amovig injections monthly but discontinued about a year ago. She reports migraines have remained well managed. She may have 1-2 per month. Sumatriptan works well for abortive therapy. She is followed by PCP regularly. She is feeling well and without concerns.   07/28/2022 AA (Mychart): Here for follow up of migrianes. She has been on Aimovig and sumatriptan for several years and has been doing well. She has not gotten her aimovig for 2 months. She is happy on the aimovig. And sumatriptan off the aimovig for several months due to insurance. I gave her 3 samples, will represcribe, if she does not get approved need ot try another injection.discussed and she agreed   03/05/2021 ALL: Tracy Holder is a 57 y.o. female here today for follow up for migraines. She continues Amovig monthly and sumatriptan for abortive therapy. No longer on Botox therapy (was effective but insurance would not cover). She is doing very well. She feels Amovig has been a miracle drug. She has about 1 migraine per month. Sumatriptan works well for abortive therapy. She is very happy with how well migraines are managed.   HISTORY (copied from Dr Trevor Mace previous note)  Interval history 12/26/2019: The mask gives her a headache, she has been on Aimovig and over the last year she has 15 headache days a month and 10 migraine sdays a month. We will stop the Aimovig. No aura. No medicaton overuse. Botox in the past worked extremely well.    Interval history 09/13/2018: She takes imitrex po, injections worked great a year ago. She has 20 headaches days a month and 10 of those are migrainous > 6months. No aura. No medication overuse. Tried Botox  n the past and it worked but Health visitor it. She has tried and failed multiple medications. Will try Aimovig. Botox would be much preferable she did excellent on botox.    She has tried and failed Topamax, Lexapro, cyclobenzaprine, fluoxetine. Wellbutrin. Neurontin. Zoloft.  Propranolol.  At least 5 days a week for the 6 months, 20 migraine days a month, they last 18-24 hours a day sometimes longer. They are debilitating 10/10 pain. Imitrex helps but doesn't totally take away the pain. Getting worse. Left side, throbbing, whole head with pressure, +light sensitivity, +sound sensitivity. Movement makes it worse. Has to go into a dark room. +nausea. She does not take OTC medications more than 2 days per week or 2x a day.   HPI:  Tracy Holder is a 57 y.o. female here as a referral from Dr. Kateri Plummer for Trigeminal Neuralgia   Left facial numbness and tingling been going on for 6 months. Just started one day, no inciting factors. Went to the ENT because she thought it was sinus related. ENT ruled out any sinus infections. She has numbness on the left side of the face around the eye and cheek to the left ear, sometimes tingly. Continuous, waxes and wanes as far as the severity. Unknown triggers or what makes it better or worse. Her left eye really bothers her like pressure. No shooting pains, no electric, shock-like or stabbing pains. She was scoped by ENT and she was given an antibiotic which significantly  helped but 2 weeks afterwards the symptoms came back. The symptoms can become 8-9/10 severe pain. She gets a heaviness in left face, puffiness. She has some neck pain. No other focal or otherwise neurologic symptoms. Allergy meds didn't help.    She has a history of migraines. They have been going on for years. The past year they are worse. At least 5 days a week for the last year, they last 6-12 hours a day sometimes longer. They are debilitating 10/10 pain. Imitrex helps but doesn't totally take  away the pain. Getting worse. Left side, throbbing, whole head with pressure, +light sensitivity, +sound sensitivity. Has to go into a dark room. +nausea. She does not take OTC medications more than 2 days per week or 2x a day. She is on Lexapro.  She has tried and failed Topamax, cyclobenzaprine, fluoxetine.    Shingles 10 years ago in the same left face area. But these symptoms didn't start until 6 months ago.   Reviewed notes, labs and imaging from outside physicians, which showed: Personally reviewed MRi of the brain, no large ischemic strokes, no tumors or masses, normal architecture, nothing in the pons area or cp angle compressing CN5. ENT notes stae that endoscopy did not show etiology for symptoms. Notes state symptoms for 1.5 years.   REVIEW OF SYSTEMS: Out of a complete 14 system review of symptoms, the patient complains only of the following symptoms, headaches and all other reviewed systems are negative.   ALLERGIES: No Known Allergies   HOME MEDICATIONS: Outpatient Medications Prior to Visit  Medication Sig Dispense Refill   SUMAtriptan (IMITREX) 100 MG tablet TAKE 1 TAB AT START OF HEADACHE. MAY REPEAT IN 2 HOURS. DONT TAKE MORE THEN 2 A DAY OR 2 DAYS WEEK. 10 tablet 0   buPROPion (WELLBUTRIN XL) 150 MG 24 hr tablet Take 150 mg by mouth daily.  (Patient not taking: Reported on 08/24/2023)  0   [employer] ALLERGY RELIEF-D 5-120 MG tablet TAKE 1 TABLET BY MOUTH EVERY DAY (Patient not taking: Reported on 08/24/2023) 30 tablet 5   Erenumab-aooe (AIMOVIG) 140 MG/ML SOAJ Inject 140 mg into the skin every 30 (thirty) days. (Patient not taking: Reported on 08/24/2023) 3 mL 0   Erenumab-aooe (AIMOVIG) 140 MG/ML SOAJ Inject 140 mg into the skin every 30 (thirty) days. (Patient not taking: Reported on 08/24/2023) 1 mL 11   No facility-administered medications prior to visit.     PAST MEDICAL HISTORY: Past Medical History:  Diagnosis Date   Migraine      PAST SURGICAL HISTORY: Past  Surgical History:  Procedure Laterality Date   BACK SURGERY     HARR[place] ROD   CESAREAN SECTION     x2     FAMILY HISTORY: Family History  Problem Relation Age of Onset   Migraines Neg Hx      SOCIAL HISTORY: Social History   Socioeconomic History   Marital status: Married    Spouse name: Jody    Number of children: 2   Years of education: MASTERS   Highest education level: Not on file  Occupational History   Not on file  Tobacco Use   Smoking status: Never   Smokeless tobacco: Never  Vaping Use   Vaping status: Never Used  Substance and Sexual Activity   Alcohol use: Yes    Comment: 1-2 WEEK   Drug use: No   Sexual activity: Not on file  Other Topics Concern   Not on file  Social History Narrative  Patient lives at home with husband   Patient is right handed.    Right handed   Caffeine: 1 cup/day   Social Determinants of Health   Financial Resource Strain: Not on file  Food Insecurity: Not on file  Transportation Needs: Not on file  Physical Activity: Not on file  Stress: Not on file  Social Connections: Not on file  Intimate Partner Violence: Not on file      PHYSICAL EXAM  Vitals:   08/24/23 0746 08/24/23 0752  BP: (!) 142/88 132/84  Pulse: 70   Weight: 141 lb 8 oz (64.2 kg)   Height: 5\' 2"  (1.575 m)     Body mass index is 25.88 kg/m.   Generalized: Well developed, in no acute distress  Cardiology: normal rate and rhythm, no murmur auscultated  Respiratory: clear to auscultation bilaterally    Neurological examination  Mentation: Alert oriented to time, place, history taking. Follows all commands speech and language fluent Cranial nerve II-XII: Pupils were equal round reactive to light. Extraocular movements were full, visual field were full on confrontational test. Facial sensation and strength were normal. Head turning and shoulder shrug  were normal and symmetric. Motor: The motor testing reveals 5 over 5 strength of all 4  extremities. Good symmetric motor tone is noted throughout.  Gait and station: Gait is normal.    DIAGNOSTIC DATA (LABS, IMAGING, TESTING) - I reviewed patient records, labs, notes, testing and imaging myself where available.  Lab Results  Component Value Date   WBC 5.1 10/16/2014   HGB 12.8 10/16/2014   HCT 37.5 10/16/2014   MCV 93 10/16/2014   PLT 228 10/16/2014      Component Value Date/Time   NA 139 10/16/2014 1146   K 3.8 10/16/2014 1146   CL 102 10/16/2014 1146   CO2 24 10/16/2014 1146   GLUCOSE 60 (L) 10/16/2014 1146   BUN 12 10/16/2014 1146   CREATININE 0.78 10/16/2014 1146   CALCIUM 9.2 10/16/2014 1146   PROT 6.7 10/16/2014 1146   ALBUMIN 4.4 10/16/2014 1146   AST 22 10/16/2014 1146   ALT 12 10/16/2014 1146   ALKPHOS 59 10/16/2014 1146   BILITOT 0.3 10/16/2014 1146   GFRNONAA 91 10/16/2014 1146   GFRAA 105 10/16/2014 1146   No results found for: "CHOL", "HDL", "LDLCALC", "LDLDIRECT", "TRIG", "CHOLHDL" No results found for: "HGBA1C" No results found for: "VITAMINB12" No results found for: "TSH"      No data to display               No data to display           ASSESSMENT AND PLAN  57 y.o. year old female  has a past medical history of Migraine. here with    Chronic migraine without aura without status migrainosus, not intractable - Plan: SUMAtriptan (IMITREX) 100 MG tablet  Tracy Holder is doing very well on sumatriptan PRN. We will continue current treatment plan. May consider restarting Amovig in future as needed. She will continue healthy lifestyle habits. She will follow up with PCP as directed. May continue sumatriptan refills through PCP. May follow up with me as needed.    No orders of the defined types were placed in this encounter.    Meds ordered this encounter  Medications   SUMAtriptan (IMITREX) 100 MG tablet    Sig: TAKE 1 TAB AT START OF HEADACHE. MAY REPEAT IN 2 HOURS. DONT TAKE MORE THEN 2 A DAY OR 2 DAYS WEEK.  Dispense:  10  tablet    Refill:  11    Order Specific Question:   Supervising Provider    Answer:   Anson Fret [[id number]]    Shawnie Dapper, MSN, FNP-C 08/24/2023, 8:38 AM  [facility] [address] [place], [place] [address] [phone]

## 2023-08-24 ENCOUNTER — Ambulatory Visit: Payer: PRIVATE HEALTH INSURANCE | Admitting: Family Medicine

## 2023-08-24 ENCOUNTER — Encounter: Payer: Self-pay | Admitting: Family Medicine

## 2023-08-24 VITALS — BP 132/84 | HR 70 | Ht 62.0 in | Wt 141.5 lb

## 2023-08-24 DIAGNOSIS — G43709 Chronic migraine without aura, not intractable, without status migrainosus: Secondary | ICD-10-CM | POA: Diagnosis not present

## 2023-08-24 MED ORDER — SUMATRIPTAN SUCCINATE 100 MG PO TABS: ORAL_TABLET | ORAL | 11 refills | Status: DC

## 2024-01-11 ENCOUNTER — Encounter: Payer: Self-pay | Admitting: Family Medicine

## 2024-01-11 ENCOUNTER — Other Ambulatory Visit: Payer: Self-pay | Admitting: Family Medicine

## 2024-01-11 MED ORDER — ERENUMAB-AOOE 140 MG/ML ~~LOC~~ SOAJ: 140.0000 mg | SUBCUTANEOUS | 3 refills | Status: DC

## 2024-01-12 ENCOUNTER — Other Ambulatory Visit: Payer: Self-pay | Admitting: Family Medicine

## 2024-01-12 ENCOUNTER — Telehealth: Payer: Self-pay

## 2024-01-12 ENCOUNTER — Other Ambulatory Visit (HOSPITAL_COMMUNITY): Payer: Self-pay

## 2024-01-12 NOTE — Telephone Encounter (Signed)
 Lvm 1st attempt by hf 01/12/24

## 2024-01-12 NOTE — Telephone Encounter (Signed)
Aimovig pa needed

## 2024-01-12 NOTE — Telephone Encounter (Signed)
 Pharmacy Patient Advocate Encounter   Received notification from Pt Calls Messages that prior authorization for Aimovig 140MG /ML auto-injectors is required/requested.   Insurance verification completed.   The patient is insured through [facility] .   KEY [id number]  Prior Authorization form/request asks a question that requires your assistance. Please see the question below and advise accordingly. The PA will not be submitted until the necessary information is received.

## 2024-01-12 NOTE — Telephone Encounter (Signed)
 PA request has been Cancelled. New Encounter has been or will be created for follow up. For additional info see Pharmacy Prior Auth telephone encounter from 01-12-2024.

## 2024-01-16 ENCOUNTER — Encounter: Payer: Self-pay | Admitting: Family Medicine

## 2024-01-16 ENCOUNTER — Telehealth: Payer: PRIVATE HEALTH INSURANCE | Admitting: Family Medicine

## 2024-01-16 DIAGNOSIS — G43709 Chronic migraine without aura, not intractable, without status migrainosus: Secondary | ICD-10-CM

## 2024-01-16 MED ORDER — ERENUMAB-AOOE 140 MG/ML ~~LOC~~ SOAJ: 140.0000 mg | SUBCUTANEOUS | 3 refills | Status: DC

## 2024-01-16 NOTE — Patient Instructions (Signed)
 Below is our plan:  We will restart Amovig every 30 days. Continue sumatriptan as needed.   Please make sure you are staying well hydrated. I recommend 50-60 ounces daily. Well balanced diet and regular exercise encouraged. Consistent sleep schedule with 6-8 hours recommended.   Please continue follow up with care team as directed.   Follow up with me in 6 months   You may receive a survey regarding today's visit. I encourage you to leave honest feed back as I do use this information to improve patient care. Thank you for seeing me today!   GENERAL HEADACHE INFORMATION:   Natural supplements: Magnesium Oxide or Magnesium Glycinate 500 mg at bed (up to 800 mg daily) Coenzyme Q10 300 mg in AM Vitamin B2- 200 mg twice a day   Add 1 supplement at a time since even natural supplements can have undesirable side effects. You can sometimes buy supplements cheaper (especially Coenzyme Q10) at www.WebmailGuide.co.za or at [facility].  Migraine with aura: There is increased risk for stroke in women with migraine with aura and a contraindication for the combined contraceptive pill for use by women who have migraine with aura. The risk for women with migraine without aura is lower. However other risk factors like smoking are far more likely to increase stroke risk than migraine. There is a recommendation for no smoking and for the use of OCPs without estrogen such as progestogen only pills particularly for women with migraine with aura.Marland Kitchen People who have migraine headaches with auras may be 3 times more likely to have a stroke caused by a blood clot, compared to migraine patients who don't see auras. Women who take hormone-replacement therapy may be 30 percent more likely to suffer a clot-based stroke than women not taking medication containing estrogen. Other risk factors like smoking and high blood pressure may be  much more important.    Vitamins and herbs that show potential:   Magnesium: Magnesium (250 mg twice  a day or 500 mg at bed) has a relaxant effect on smooth muscles such as blood vessels. Individuals suffering from frequent or daily headache usually have low magnesium levels which can be increase with daily supplementation of 400-750 mg. Three trials found 40-90% average headache reduction  when used as a preventative. Magnesium may help with headaches are aura, the best evidence for magnesium is for migraine with aura is its thought to stop the cortical spreading depression we believe is the pathophysiology of migraine aura.Magnesium also demonstrated the benefit in menstrually related migraine.  Magnesium is part of the messenger system in the serotonin cascade and it is a good muscle relaxant.  It is also useful for constipation which can be a side effect of other medications used to treat migraine. Good sources include nuts, whole grains, and tomatoes. Side Effects: loose stool/diarrhea  Riboflavin (vitamin B 2) 200 mg twice a day. This vitamin assists nerve cells in the production of ATP a principal energy storing molecule.  It is necessary for many chemical reactions in the body.  There have been at least 3 clinical trials of riboflavin using 400 mg per day all of which suggested that migraine frequency can be decreased.  All 3 trials showed significant improvement in over half of migraine sufferers.  The supplement is found in bread, cereal, milk, meat, and poultry.  Most Americans get more riboflavin than the recommended daily allowance, however riboflavin deficiency is not necessary for the supplements to help prevent headache. Side effects: energizing, green urine  Coenzyme Q10: This is present in almost all cells in the body and is critical component for the conversion of energy.  Recent studies have shown that a nutritional supplement of CoQ10 can reduce the frequency of migraine attacks by improving the energy production of cells as with riboflavin.  Doses of 150 mg twice a day have been shown to  be effective.   Melatonin: Increasing evidence shows correlation between melatonin secretion and headache conditions.  Melatonin supplementation has decreased headache intensity and duration.  It is widely used as a sleep aid.  Sleep is natures way of dealing with migraine.  A dose of 3 mg is recommended to start for headaches including cluster headache. Higher doses up to 15 mg has been reviewed for use in Cluster headache and have been used. The rationale behind using melatonin for cluster is that many theories regarding the cause of Cluster headache center around the disruption of the normal circadian rhythm in the brain.  This helps restore the normal circadian rhythm.   HEADACHE DIET: Foods and beverages which may trigger migraine Note that only 20% of headache patients are food sensitive. You will know if you are food sensitive if you get a headache consistently 20 minutes to 2 hours after eating a certain food. Only cut out a food if it causes headaches, otherwise you might remove foods you enjoy! What matters most for diet is to eat a well balanced healthy diet full of vegetables and low fat protein, and to not miss meals.   Chocolate, other sweets ALL cheeses except cottage and cream cheese Dairy products, yogurt, sour cream, ice cream Liver Meat extracts (Bovril, Marmite, meat tenderizers) Meats or fish which have undergone aging, fermenting, pickling or smoking. These include: Hotdogs,salami,Lox,sausage, mortadellas,smoked salmon, pepperoni, Pickled herring Pods of broad bean (English beans, Chinese pea pods, [place] (fava) beans, lima and navy beans Ripe avocado, ripe banana Yeast extracts or active yeast preparations such as Brewer's or Fleishman's (commercial bakes goods are permitted) Tomato based foods, pizza (lasagna, etc.)   MSG (monosodium glutamate) is disguised as many things; look for these common aliases: Monopotassium glutamate Autolysed yeast Hydrolysed protein Sodium  caseinate "flavorings" "all natural preservatives" Nutrasweet   Avoid all other foods that convincingly provoke headaches.   Resources: The Dizzy [place]air Laundry Your Headache Diet, migrainestrong.com  https://zamora-andrews.com/   Caffeine and Migraine For patients that have migraine, caffeine intake more than 3 days per week can lead to dependency and increased migraine frequency. I would recommend cutting back on your caffeine intake as best you can. The recommended amount of caffeine is 200-300 mg daily, although migraine patients may experience dependency at even lower doses. While you may notice an increase in headache temporarily, cutting back will be helpful for headaches in the long run. For more information on caffeine and migraine, visit: https://americanmigrainefoundation.org/resource-library/caffeine-and-migraine/   Headache Prevention Strategies:   1. Maintain a headache diary; learn to identify and avoid triggers.  - This can be a simple note where you log when you had a headache, associated symptoms, and medications used - There are several smartphone apps developed to help track migraines: Migraine Buddy, Migraine Monitor, Curelator N1-Headache App   Common triggers include: Emotional triggers: Emotional/Upset family or friends Emotional/Upset occupation Business reversal/success Anticipation anxiety Crisis-serious Post-crisis periodNew job/position   Physical triggers: Vacation Day Weekend Strenuous Exercise High Altitude Location New Move Menstrual Day Physical Illness Oversleep/Not enough sleep Weather changes Light: Photophobia or light sesnitivity treatment involves a balance between desensitization and reduction  in overly strong input. Use dark polarized glasses outside, but not inside. Avoid bright or fluorescent light, but do not dim environment to the point that going into a normally lit room hurts. Consider  FL-41 tint lenses, which reduce the most irritating wavelengths without blocking too much light.  These can be obtained at axonoptics.com or theraspecs.com Foods: see list above.   2. Limit use of acute treatments (over-the-counter medications, triptans, etc.) to no more than 2 days per week or 10 days per month to prevent medication overuse headache (rebound headache).     3. Follow a regular schedule (including weekends and holidays): Don't skip meals. Eat a balanced diet. 8 hours of sleep nightly. Minimize stress. Exercise 30 minutes per day. Being overweight is associated with a 5 times increased risk of chronic migraine. Keep well hydrated and drink 6-8 glasses of water per day.   4. Initiate non-pharmacologic measures at the earliest onset of your headache. Rest and quiet environment. Relax and reduce stress. Breathe2Relax is a free app that can instruct you on    some simple relaxtion and breathing techniques. Http://Dawnbuse.com is a    free website that provides teaching videos on relaxation.  Also, there are  many apps that   can be downloaded for "mindful" relaxation.  An app called YOGA NIDRA will help walk you through mindfulness. Another app called Calm can be downloaded to give you a structured mindfulness guide with daily reminders and skill development. Headspace for guided meditation Mindfulness Based Stress Reduction Online Course: www.palousemindfulness.com Cold compresses.   5. Don't wait!! Take the maximum allowable dosage of prescribed medication at the first sign of migraine.   6. Compliance:  Take prescribed medication regularly as directed and at the first sign of a migraine.   7. Communicate:  Call your physician when problems arise, especially if your headaches change, increase in frequency/severity, or become associated with neurological symptoms (weakness, numbness, slurred speech, etc.). Proceed to emergency room if you experience new or worsening symptoms or  symptoms do not resolve, if you have new neurologic symptoms or if headache is severe, or for any concerning symptom.   8. Headache/pain management therapies: Consider various complementary methods, including medication, behavioral therapy, psychological counselling, biofeedback, massage therapy, acupuncture, dry needling, and other modalities.  Such measures may reduce the need for medications. Counseling for pain management, where patients learn to function and ignore/minimize their pain, seems to work very well.   9. Recommend changing family's attention and focus away from patient's headaches. Instead, emphasize daily activities. If first question of day is 'How are your headaches/Do you have a headache today?', then patient will constantly think about headaches, thus making them worse. Goal is to re-direct attention away from headaches, toward daily activities and other distractions.   10. Helpful Websites: www.AmericanHeadacheSociety.org PatentHood.ch www.headaches.org TightMarket.nl www.achenet.org

## 2024-01-16 NOTE — Progress Notes (Signed)
 PATIENT: Tracy Holder DOB: [birth year]  REASON FOR VISIT: follow up HISTORY FROM: patient  Virtual Visit via MyChart video  I connected with Tracy Holder on 01/16/24 at  3:45 PM EDT via MyChart video and verified that I am speaking with the correct person using two identifiers.   I discussed the limitations, risks, security and privacy concerns of performing an evaluation and management service by Mychart video and the availability of in person appointments. I also discussed with the patient that there may be a patient responsible charge related to this service. The patient expressed understanding and agreed to proceed.   History of Present Illness:  01/16/24 ALL: Tracy Holder is a 58 y.o. female here today for follow up for migraines. She was last seen 08/2023 and doing well on sumatriptan as needed. Since, she Holder headaches have become more frequent. She is now having at least 12-15 headache days a month. Usually 8-10 are migraines. She is going through a full prescription of sumatriptan each month for the past two months. Sumatriptan does usually help. She was previously on Amovig and felt it worked well.   08/24/23 ALL:  Tracy Holder for follow up for migraines. She was previously on Amovig injections monthly but discontinued about a year ago. She Holder migraines have remained well managed. She may have 1-2 per month. Sumatriptan works well for abortive therapy. She is followed by PCP regularly. She is feeling well and without concerns.    07/28/2022 AA (Mychart): Here for follow up of migrianes. She has been on Aimovig and sumatriptan for several years and has been doing well. She has not gotten her aimovig for 2 months. She is happy on the aimovig. And sumatriptan off the aimovig for several months due to insurance. I gave her 3 samples, will represcribe, if she does not get approved need ot try another injection.discussed and she agreed    03/05/2021 ALL: Tracy Holder is a 58  y.o. female here today for follow up for migraines. She continues Amovig monthly and sumatriptan for abortive therapy. No longer on Botox therapy (was effective but insurance would not cover). She is doing very well. She feels Amovig has been a miracle drug. She has about 1 migraine per month. Sumatriptan works well for abortive therapy. She is very happy with how well migraines are managed.    HISTORY (copied from Dr Trevor Mace previous note)   Interval history 12/26/2019: The mask gives her a headache, she has been on Aimovig and over the last year she has 15 headache days a month and 10 migraine sdays a month. We will stop the Aimovig. No aura. No medicaton overuse. Botox in the past worked extremely well.    Interval history 09/13/2018: She takes imitrex po, injections worked great a year ago. She has 20 headaches days a month and 10 of those are migrainous > 6months. No aura. No medication overuse. Tried Botox n the past and it worked but Health visitor it. She has tried and failed multiple medications. Will try Aimovig. Botox would be much preferable she did excellent on botox.    She has tried and failed Topamax, Lexapro, cyclobenzaprine, fluoxetine. Wellbutrin. Neurontin. Zoloft.  Propranolol.  At least 5 days a week for the 6 months, 20 migraine days a month, they last 18-24 hours a day sometimes longer. They are debilitating 10/10 pain. Imitrex helps but doesn't totally take away the pain. Getting worse. Left side, throbbing, whole head with pressure, +light sensitivity, +sound sensitivity. Movement makes  it worse. Has to go into a dark room. +nausea. She does not take OTC medications more than 2 days per week or 2x a day.   HPI:  Tracy Holder is a 58 y.o. female here as a referral from Dr. Kateri Plummer for Trigeminal Neuralgia   Left facial numbness and tingling been going on for 6 months. Just started one day, no inciting factors. Went to the ENT because she thought it was sinus related. ENT  ruled out any sinus infections. She has numbness on the left side of the face around the eye and cheek to the left ear, sometimes tingly. Continuous, waxes and wanes as far as the severity. Unknown triggers or what makes it better or worse. Her left eye really bothers her like pressure. No shooting pains, no electric, shock-like or stabbing pains. She was scoped by ENT and she was given an antibiotic which significantly helped but 2 weeks afterwards the symptoms came back. The symptoms can become 8-9/10 severe pain. She gets a heaviness in left face, puffiness. She has some neck pain. No other focal or otherwise neurologic symptoms. Allergy meds didn't help.    She has a history of migraines. They have been going on for years. The past year they are worse. At least 5 days a week for the last year, they last 6-12 hours a day sometimes longer. They are debilitating 10/10 pain. Imitrex helps but doesn't totally take away the pain. Getting worse. Left side, throbbing, whole head with pressure, +light sensitivity, +sound sensitivity. Has to go into a dark room. +nausea. She does not take OTC medications more than 2 days per week or 2x a day. She is on Lexapro.  She has tried and failed Topamax, cyclobenzaprine, fluoxetine.    Shingles 10 years ago in the same left face area. But these symptoms didn't start until 6 months ago.   Reviewed notes, labs and imaging from outside physicians, which showed: Personally reviewed MRi of the brain, no large ischemic strokes, no tumors or masses, normal architecture, nothing in the pons area or cp angle compressing CN5. ENT notes stae that endoscopy did not show etiology for symptoms. Notes state symptoms for 1.5 years.  Observations/Objective:  Generalized: Well developed, in no acute distress  Mentation: Alert oriented to time, place, history taking. Follows all commands speech and language fluent   Assessment and Plan:  58 y.o. year old female  has a past medical  history of Migraine. here with    ICD-10-CM   1. Chronic migraine without aura without status migrainosus, not intractable  G43.709       Tracy Holder worsening headaches over the past few month. Now having 12-15 headaches with 8-10 migraines per month. We will restart Amovig every 30 days. She will continue sumatriptan as needed. Follow up scheduled in 6 months.   No orders of the defined types were placed in this encounter.   Meds ordered this encounter  Medications   Erenumab-aooe 140 MG/ML SOAJ    Sig: Inject 140 mg into the skin every 30 (thirty) days.    Dispense:  3 mL    Refill:  3    Supervising Provider:   Anson Fret [[id number]]     Follow Up Instructions:  I discussed the assessment and treatment plan with the patient. The patient was provided an opportunity to ask questions and all were answered. The patient agreed with the plan and demonstrated an understanding of the instructions.   The patient was advised to  call back or seek an in-person evaluation if the symptoms worsen or if the condition fails to improve as anticipated.  I provided 15 minutes of face-to-face and non face-to-face time during this MyChart video encounter. Patient located at their place of residence. Provider is in the office.    Shawnie Dapper, NP

## 2024-01-17 ENCOUNTER — Telehealth: Payer: Self-pay

## 2024-01-17 DIAGNOSIS — G43701 Chronic migraine without aura, not intractable, with status migrainosus: Secondary | ICD-10-CM

## 2024-01-17 NOTE — Telephone Encounter (Signed)
 Pharmacy Patient Advocate Encounter   Received notification from Pt Calls Messages that prior authorization for Aimovig 140MG /ML auto-injectors is required/requested.   Insurance verification completed.   The patient is insured through [facility] .   Per test claim: PA required; PA submitted to above mentioned insurance via CoverMyMeds Key/confirmation #/EOC [id number] Status is pending

## 2024-01-18 ENCOUNTER — Other Ambulatory Visit (HOSPITAL_COMMUNITY): Payer: Self-pay

## 2024-01-18 NOTE — Telephone Encounter (Signed)
 Pharmacy Patient Advocate Encounter  Received notification from [facility] that Prior Authorization for Aimovig 140MG /ML auto-injectors has been APPROVED from 01/17/2024 to 04/10/2024. Ran test claim, Copay is $701.49 per 30DS. This test claim was processed through [facility]- copay amounts may vary at other pharmacies due to pharmacy/plan contracts, or as the patient moves through the different stages of their insurance plan.   PA #/Case ID/Reference #: PA Case ID #: [id number]

## 2024-01-23 MED ORDER — AIMOVIG 140 MG/ML ~~LOC~~ SOAJ: 1.0000 mg | SUBCUTANEOUS | 11 refills | Status: AC

## 2024-01-23 NOTE — Addendum Note (Signed)
 Addended by: Danne Harbor on: 01/23/2024 02:22 PM   Modules accepted: Orders

## 2024-01-30 ENCOUNTER — Encounter: Payer: Self-pay | Admitting: Family Medicine

## 2024-07-19 NOTE — Patient Instructions (Incomplete)
 Below is our plan:  We will continue Amovig every 30 days and sumatriptan  as needed  Please make sure you are staying well hydrated. I recommend 50-60 ounces daily. Well balanced diet and regular exercise encouraged. Consistent sleep schedule with 6-8 hours recommended.   Please continue follow up with care team as directed.   Follow up with me in 1 year   You may receive a survey regarding today's visit. I encourage you to leave honest feed back as I do use this information to improve patient care. Thank you for seeing me today!   GENERAL HEADACHE INFORMATION:   Natural supplements: Magnesium Oxide or Magnesium Glycinate 500 mg at bed (up to 800 mg daily) Coenzyme Q10 300 mg in AM Vitamin B2- 200 mg twice a day   Add 1 supplement at a time since even natural supplements can have undesirable side effects. You can sometimes buy supplements cheaper (especially Coenzyme Q10) at www.WebmailGuide.co.za or at [facility].  Migraine with aura: There is increased risk for stroke in women with migraine with aura and a contraindication for the combined contraceptive pill for use by women who have migraine with aura. The risk for women with migraine without aura is lower. However other risk factors like smoking are far more likely to increase stroke risk than migraine. There is a recommendation for no smoking and for the use of OCPs without estrogen such as progestogen only pills particularly for women with migraine with aura.SABRA People who have migraine headaches with auras may be 3 times more likely to have a stroke caused by a blood clot, compared to migraine patients who don't see auras. Women who take hormone-replacement therapy may be 30 percent more likely to suffer a clot-based stroke than women not taking medication containing estrogen. Other risk factors like smoking and high blood pressure may be  much more important.    Vitamins and herbs that show potential:   Magnesium: Magnesium (250 mg twice a day or  500 mg at bed) has a relaxant effect on smooth muscles such as blood vessels. Individuals suffering from frequent or daily headache usually have low magnesium levels which can be increase with daily supplementation of 400-750 mg. Three trials found 40-90% average headache reduction  when used as a preventative. Magnesium may help with headaches are aura, the best evidence for magnesium is for migraine with aura is its thought to stop the cortical spreading depression we believe is the pathophysiology of migraine aura.Magnesium also demonstrated the benefit in menstrually related migraine.  Magnesium is part of the messenger system in the serotonin cascade and it is a good muscle relaxant.  It is also useful for constipation which can be a side effect of other medications used to treat migraine. Good sources include nuts, whole grains, and tomatoes. Side Effects: loose stool/diarrhea  Riboflavin (vitamin B 2) 200 mg twice a day. This vitamin assists nerve cells in the production of ATP a principal energy storing molecule.  It is necessary for many chemical reactions in the body.  There have been at least 3 clinical trials of riboflavin using 400 mg per day all of which suggested that migraine frequency can be decreased.  All 3 trials showed significant improvement in over half of migraine sufferers.  The supplement is found in bread, cereal, milk, meat, and poultry.  Most Americans get more riboflavin than the recommended daily allowance, however riboflavin deficiency is not necessary for the supplements to help prevent headache. Side effects: energizing, green urine  Coenzyme Q10: This is present in almost all cells in the body and is critical component for the conversion of energy.  Recent studies have shown that a nutritional supplement of CoQ10 can reduce the frequency of migraine attacks by improving the energy production of cells as with riboflavin.  Doses of 150 mg twice a day have been shown to be  effective.   Melatonin: Increasing evidence shows correlation between melatonin secretion and headache conditions.  Melatonin supplementation has decreased headache intensity and duration.  It is widely used as a sleep aid.  Sleep is natures way of dealing with migraine.  A dose of 3 mg is recommended to start for headaches including cluster headache. Higher doses up to 15 mg has been reviewed for use in Cluster headache and have been used. The rationale behind using melatonin for cluster is that many theories regarding the cause of Cluster headache center around the disruption of the normal circadian rhythm in the brain.  This helps restore the normal circadian rhythm.   HEADACHE DIET: Foods and beverages which may trigger migraine Note that only 20% of headache patients are food sensitive. You will know if you are food sensitive if you get a headache consistently 20 minutes to 2 hours after eating a certain food. Only cut out a food if it causes headaches, otherwise you might remove foods you enjoy! What matters most for diet is to eat a well balanced healthy diet full of vegetables and low fat protein, and to not miss meals.   Chocolate, other sweets ALL cheeses except cottage and cream cheese Dairy products, yogurt, sour cream, ice cream Liver Meat extracts (Bovril, Marmite, meat tenderizers) Meats or fish which have undergone aging, fermenting, pickling or smoking. These include: Hotdogs,salami,Lox,sausage, mortadellas,smoked salmon, pepperoni, Pickled herring Pods of broad bean (English beans, Chinese pea pods, [place] (fava) beans, lima and navy beans Ripe avocado, ripe banana Yeast extracts or active yeast preparations such as Brewer's or Fleishman's (commercial bakes goods are permitted) Tomato based foods, pizza (lasagna, etc.)   MSG (monosodium glutamate) is disguised as many things; look for these common aliases: Monopotassium glutamate Autolysed yeast Hydrolysed protein Sodium  caseinate "flavorings" "all natural preservatives Nutrasweet   Avoid all other foods that convincingly provoke headaches.   Resources: The Dizzy Bluford Aid Your Headache Diet, migrainestrong.com  https://zamora-andrews.com/   Caffeine and Migraine For patients that have migraine, caffeine intake more than 3 days per week can lead to dependency and increased migraine frequency. I would recommend cutting back on your caffeine intake as best you can. The recommended amount of caffeine is 200-300 mg daily, although migraine patients may experience dependency at even lower doses. While you may notice an increase in headache temporarily, cutting back will be helpful for headaches in the long run. For more information on caffeine and migraine, visit: https://americanmigrainefoundation.org/resource-library/caffeine-and-migraine/   Headache Prevention Strategies:   1. Maintain a headache diary; learn to identify and avoid triggers.  - This can be a simple note where you log when you had a headache, associated symptoms, and medications used - There are several smartphone apps developed to help track migraines: Migraine Buddy, Migraine Monitor, Curelator N1-Headache App   Common triggers include: Emotional triggers: Emotional/Upset family or friends Emotional/Upset occupation Business reversal/success Anticipation anxiety Crisis-serious Post-crisis periodNew job/position   Physical triggers: Vacation Day Weekend Strenuous Exercise High Altitude Location New Move Menstrual Day Physical Illness Oversleep/Not enough sleep Weather changes Light: Photophobia or light sesnitivity treatment involves a balance between desensitization and reduction  in overly strong input. Use dark polarized glasses outside, but not inside. Avoid bright or fluorescent light, but do not dim environment to the point that going into a normally lit room hurts. Consider  FL-41 tint lenses, which reduce the most irritating wavelengths without blocking too much light.  These can be obtained at axonoptics.com or theraspecs.com Foods: see list above.   2. Limit use of acute treatments (over-the-counter medications, triptans, etc.) to no more than 2 days per week or 10 days per month to prevent medication overuse headache (rebound headache).     3. Follow a regular schedule (including weekends and holidays): Don't skip meals. Eat a balanced diet. 8 hours of sleep nightly. Minimize stress. Exercise 30 minutes per day. Being overweight is associated with a 5 times increased risk of chronic migraine. Keep well hydrated and drink 6-8 glasses of water per day.   4. Initiate non-pharmacologic measures at the earliest onset of your headache. Rest and quiet environment. Relax and reduce stress. Breathe2Relax is a free app that can instruct you on    some simple relaxtion and breathing techniques. Http://Dawnbuse.com is a    free website that provides teaching videos on relaxation.  Also, there are  many apps that   can be downloaded for "mindful" relaxation.  An app called YOGA NIDRA will help walk you through mindfulness. Another app called Calm can be downloaded to give you a structured mindfulness guide with daily reminders and skill development. Headspace for guided meditation Mindfulness Based Stress Reduction Online Course: www.palousemindfulness.com Cold compresses.   5. Don't wait!! Take the maximum allowable dosage of prescribed medication at the first sign of migraine.   6. Compliance:  Take prescribed medication regularly as directed and at the first sign of a migraine.   7. Communicate:  Call your physician when problems arise, especially if your headaches change, increase in frequency/severity, or become associated with neurological symptoms (weakness, numbness, slurred speech, etc.). Proceed to emergency room if you experience new or worsening symptoms or  symptoms do not resolve, if you have new neurologic symptoms or if headache is severe, or for any concerning symptom.   8. Headache/pain management therapies: Consider various complementary methods, including medication, behavioral therapy, psychological counselling, biofeedback, massage therapy, acupuncture, dry needling, and other modalities.  Such measures may reduce the need for medications. Counseling for pain management, where patients learn to function and ignore/minimize their pain, seems to work very well.   9. Recommend changing family's attention and focus away from patient's headaches. Instead, emphasize daily activities. If first question of day is 'How are your headaches/Do you have a headache today?', then patient will constantly think about headaches, thus making them worse. Goal is to re-direct attention away from headaches, toward daily activities and other distractions.   10. Helpful Websites: www.AmericanHeadacheSociety.org PatentHood.ch www.headaches.org TightMarket.nl www.achenet.org

## 2024-07-19 NOTE — Progress Notes (Deleted)
 PATIENT: Tracy Holder DOB: 06-21-1966  REASON FOR VISIT: follow up HISTORY FROM: patient  Virtual Visit via MyChart video  I connected with Tracy Holder on 07/19/24 at  9:30 AM EDT via MyChart video and verified that I am speaking with the correct person using two identifiers.   I discussed the limitations, risks, security and privacy concerns of performing an evaluation and management service by Mychart video and the availability of in person appointments. I also discussed with the patient that there may be a patient responsible charge related to this service. The patient expressed understanding and agreed to proceed.   History of Present Illness:  07/19/24 ALL (Mychart): Tracy Holder returns for follow up for migraines. We restarted Amovig and continued sumatriptan  at last visit 12/2023.   01/16/2024 ALL (Mychart):  Tracy Holder is a 58 y.o. female here today for follow up for migraines. She was last seen 08/2023 and doing well on sumatriptan  as needed. Since, she reports headaches have become more frequent. She is now having at least 12-15 headache days a month. Usually 8-10 are migraines. She is going through a full prescription of sumatriptan  each month for the past two months. Sumatriptan  does usually help. She was previously on Amovig and felt it worked well.   08/24/23 ALL:  Tracy Holder returns for follow up for migraines. She was previously on Amovig injections monthly but discontinued about a year ago. She reports migraines have remained well managed. She may have 1-2 per month. Sumatriptan  works well for abortive therapy. She is followed by PCP regularly. She is feeling well and without concerns.    07/28/2022 AA (Mychart): Here for follow up of migrianes. She has been on Aimovig  and sumatriptan  for several years and has been doing well. She has not gotten her aimovig  for 2 months. She is happy on the aimovig . And sumatriptan  off the aimovig  for several months due to insurance. I gave her 3  samples, will represcribe, if she does not get approved need ot try another injection.discussed and she agreed    03/05/2021 ALL: Tracy Holder is a 58 y.o. female here today for follow up for migraines. She continues Amovig monthly and sumatriptan  for abortive therapy. No longer on Botox  therapy (was effective but insurance would not cover). She is doing very well. She feels Amovig has been a miracle drug. She has about 1 migraine per month. Sumatriptan  works well for abortive therapy. She is very happy with how well migraines are managed.    HISTORY (copied from Dr Sharion previous note)   Interval history 12/26/2019: The mask gives her a headache, she has been on Aimovig  and over the last year she has 15 headache days a month and 10 migraine sdays a month. We will stop the Aimovig . No aura. No medicaton overuse. Botox  in the past worked extremely well.    Interval history 09/13/2018: She takes imitrex  po, injections worked great a year ago. She has 20 headaches days a month and 10 of those are migrainous > 6months. No aura. No medication overuse. Tried Botox  n the past and it worked but [facility] visitor it. She has tried and failed multiple medications. Will try Aimovig . Botox  would be much preferable she did excellent on botox .    She has tried and failed Topamax, Lexapro , cyclobenzaprine, fluoxetine. Wellbutrin. Neurontin . Zoloft.  Propranolol.  At least 5 days a week for the 6 months, 20 migraine days a month, they last 18-24 hours a day sometimes longer. They are debilitating 10/10 pain.  Imitrex  helps but doesn't totally take away the pain. Getting worse. Left side, throbbing, whole head with pressure, +light sensitivity, +sound sensitivity. Movement makes it worse. Has to go into a dark room. +nausea. She does not take OTC medications more than 2 days per week or 2x a day.   HPI:  Tracy Holder is a 58 y.o. female here as a referral from Dr. Kip for Trigeminal Neuralgia   Left facial  numbness and tingling been going on for 6 months. Just started one day, no inciting factors. Went to the ENT because she thought it was sinus related. ENT ruled out any sinus infections. She has numbness on the left side of the face around the eye and cheek to the left ear, sometimes tingly. Continuous, waxes and wanes as far as the severity. Unknown triggers or what makes it better or worse. Her left eye really bothers her like pressure. No shooting pains, no electric, shock-like or stabbing pains. She was scoped by ENT and she was given an antibiotic which significantly helped but 2 weeks afterwards the symptoms came back. The symptoms can become 8-9/10 severe pain. She gets a heaviness in left face, puffiness. She has some neck pain. No other focal or otherwise neurologic symptoms. Allergy meds didn't help.    She has a history of migraines. They have been going on for years. The past year they are worse. At least 5 days a week for the last year, they last 6-12 hours a day sometimes longer. They are debilitating 10/10 pain. Imitrex  helps but doesn't totally take away the pain. Getting worse. Left side, throbbing, whole head with pressure, +light sensitivity, +sound sensitivity. Has to go into a dark room. +nausea. She does not take OTC medications more than 2 days per week or 2x a day. She is on Lexapro .  She has tried and failed Topamax, cyclobenzaprine, fluoxetine.    Shingles 10 years ago in the same left face area. But these symptoms didn't start until 6 months ago.   Reviewed notes, labs and imaging from outside physicians, which showed: Personally reviewed MRi of the brain, no large ischemic strokes, no tumors or masses, normal architecture, nothing in the pons area or cp angle compressing CN5. ENT notes stae that endoscopy did not show etiology for symptoms. Notes state symptoms for 1.5 years.  Observations/Objective:  Generalized: Well developed, in no acute distress  Mentation: Alert oriented  to time, place, history taking. Follows all commands speech and language fluent   Assessment and Plan:  58 y.o. year old female  has a past medical history of Migraine. here with  No diagnosis found.   Tracy Holder reports worsening headaches over the past few month. Now having 12-15 headaches with 8-10 migraines per month. We will restart Amovig every 30 days. She will continue sumatriptan  as needed. Follow up scheduled in 6 months.   No orders of the defined types were placed in this encounter.   No orders of the defined types were placed in this encounter.    Follow Up Instructions:  I discussed the assessment and treatment plan with the patient. The patient was provided an opportunity to ask questions and all were answered. The patient agreed with the plan and demonstrated an understanding of the instructions.   The patient was advised to call back or seek an in-person evaluation if the symptoms worsen or if the condition fails to improve as anticipated.  I provided 15 minutes of face-to-face and non face-to-face time during this MyChart  video encounter. Patient located at their place of residence. Provider is in the office.    Tracy Adler, NP

## 2024-07-23 ENCOUNTER — Telehealth: Payer: PRIVATE HEALTH INSURANCE | Admitting: Family Medicine

## 2024-07-23 DIAGNOSIS — G43701 Chronic migraine without aura, not intractable, with status migrainosus: Secondary | ICD-10-CM

## 2024-07-23 DIAGNOSIS — G43709 Chronic migraine without aura, not intractable, without status migrainosus: Secondary | ICD-10-CM

## 2024-08-29 ENCOUNTER — Other Ambulatory Visit: Payer: Self-pay | Admitting: *Deleted

## 2024-08-29 DIAGNOSIS — G43709 Chronic migraine without aura, not intractable, without status migrainosus: Secondary | ICD-10-CM

## 2024-08-29 MED ORDER — SUMATRIPTAN SUCCINATE 100 MG PO TABS: ORAL_TABLET | ORAL | 0 refills | Status: DC

## 2024-08-29 NOTE — Telephone Encounter (Signed)
 Last seen on 01/16/24 No follow up scheduled

## 2024-10-10 ENCOUNTER — Other Ambulatory Visit: Payer: Self-pay

## 2024-10-10 DIAGNOSIS — G43709 Chronic migraine without aura, not intractable, without status migrainosus: Secondary | ICD-10-CM

## 2024-10-10 MED ORDER — SUMATRIPTAN SUCCINATE 100 MG PO TABS: ORAL_TABLET | ORAL | 0 refills | Status: AC
